# Patient Record
Sex: Female | Born: 1945
Health system: Southern US, Community
[De-identification: ages and names within clinical notes are randomized; demographics above are authoritative.]

## PROBLEM LIST (undated history)

## (undated) DIAGNOSIS — N6019 Diffuse cystic mastopathy of unspecified breast: Secondary | ICD-10-CM

## (undated) DIAGNOSIS — Z923 Personal history of irradiation: Secondary | ICD-10-CM

## (undated) DIAGNOSIS — Z780 Asymptomatic menopausal state: Secondary | ICD-10-CM

## (undated) DIAGNOSIS — E042 Nontoxic multinodular goiter: Secondary | ICD-10-CM

## (undated) DIAGNOSIS — E785 Hyperlipidemia, unspecified: Secondary | ICD-10-CM

## (undated) DIAGNOSIS — T7840XA Allergy, unspecified, initial encounter: Secondary | ICD-10-CM

## (undated) DIAGNOSIS — N943 Premenstrual tension syndrome: Secondary | ICD-10-CM

## (undated) DIAGNOSIS — M8580 Other specified disorders of bone density and structure, unspecified site: Secondary | ICD-10-CM

## (undated) DIAGNOSIS — C50919 Malignant neoplasm of unspecified site of unspecified female breast: Secondary | ICD-10-CM

## (undated) DIAGNOSIS — E049 Nontoxic goiter, unspecified: Secondary | ICD-10-CM

## (undated) HISTORY — DX: Malignant neoplasm of unspecified site of unspecified female breast: C50.919

## (undated) HISTORY — DX: Diffuse cystic mastopathy of unspecified breast: N60.19

## (undated) HISTORY — DX: Nontoxic multinodular goiter: E04.2

## (undated) HISTORY — DX: Other specified disorders of bone density and structure, unspecified site: M85.80

## (undated) HISTORY — DX: Hyperlipidemia, unspecified: E78.5

## (undated) HISTORY — DX: Allergy, unspecified, initial encounter: T78.40XA

## (undated) HISTORY — DX: Premenstrual tension syndrome: N94.3

## (undated) HISTORY — DX: Nontoxic goiter, unspecified: E04.9

## (undated) HISTORY — DX: Asymptomatic menopausal state: Z78.0

## (undated) HISTORY — PX: CERVICAL POLYPECTOMY: SHX88

---

## 2001-12-28 ENCOUNTER — Ambulatory Visit (HOSPITAL_COMMUNITY): Admission: RE | Admit: 2001-12-28 | Discharge: 2001-12-28 | Payer: Self-pay | Admitting: Gastroenterology

## 2002-12-28 ENCOUNTER — Encounter: Admission: RE | Admit: 2002-12-28 | Discharge: 2002-12-28 | Payer: Self-pay | Admitting: General Surgery

## 2003-07-14 ENCOUNTER — Encounter: Admission: RE | Admit: 2003-07-14 | Discharge: 2003-07-14 | Payer: Self-pay | Admitting: Family Medicine

## 2004-01-05 ENCOUNTER — Encounter: Admission: RE | Admit: 2004-01-05 | Discharge: 2004-01-05 | Payer: Self-pay | Admitting: Family Medicine

## 2004-05-28 DIAGNOSIS — E049 Nontoxic goiter, unspecified: Secondary | ICD-10-CM

## 2004-05-28 HISTORY — DX: Nontoxic goiter, unspecified: E04.9

## 2004-05-31 ENCOUNTER — Other Ambulatory Visit: Admission: RE | Admit: 2004-05-31 | Discharge: 2004-05-31 | Payer: Self-pay | Admitting: Family Medicine

## 2005-03-14 ENCOUNTER — Encounter: Admission: RE | Admit: 2005-03-14 | Discharge: 2005-03-14 | Payer: Self-pay | Admitting: Family Medicine

## 2005-04-04 ENCOUNTER — Encounter: Admission: RE | Admit: 2005-04-04 | Discharge: 2005-04-04 | Payer: Self-pay | Admitting: Family Medicine

## 2005-06-28 DIAGNOSIS — M8580 Other specified disorders of bone density and structure, unspecified site: Secondary | ICD-10-CM

## 2005-06-28 HISTORY — DX: Other specified disorders of bone density and structure, unspecified site: M85.80

## 2005-08-06 ENCOUNTER — Ambulatory Visit: Payer: Self-pay | Admitting: Family Medicine

## 2005-12-12 ENCOUNTER — Ambulatory Visit: Payer: Self-pay | Admitting: Family Medicine

## 2006-06-11 ENCOUNTER — Encounter: Admission: RE | Admit: 2006-06-11 | Discharge: 2006-06-11 | Payer: Self-pay | Admitting: Family Medicine

## 2006-06-24 ENCOUNTER — Ambulatory Visit: Payer: Self-pay | Admitting: Family Medicine

## 2006-07-11 ENCOUNTER — Other Ambulatory Visit: Admission: RE | Admit: 2006-07-11 | Discharge: 2006-07-11 | Payer: Self-pay | Admitting: Unknown Physician Specialty

## 2006-07-24 ENCOUNTER — Ambulatory Visit: Payer: Self-pay | Admitting: Family Medicine

## 2007-07-07 ENCOUNTER — Encounter: Admission: RE | Admit: 2007-07-07 | Discharge: 2007-07-07 | Payer: Self-pay | Admitting: Family Medicine

## 2007-09-03 ENCOUNTER — Ambulatory Visit: Payer: Self-pay | Admitting: Family Medicine

## 2007-09-03 ENCOUNTER — Other Ambulatory Visit: Admission: RE | Admit: 2007-09-03 | Discharge: 2007-09-03 | Payer: Self-pay | Admitting: Family Medicine

## 2007-09-03 LAB — HM PAP SMEAR: HM Pap smear: NORMAL

## 2007-09-08 ENCOUNTER — Ambulatory Visit: Payer: Self-pay | Admitting: Family Medicine

## 2008-04-18 ENCOUNTER — Ambulatory Visit: Payer: Self-pay | Admitting: Family Medicine

## 2008-07-21 ENCOUNTER — Encounter: Admission: RE | Admit: 2008-07-21 | Discharge: 2008-07-21 | Payer: Self-pay | Admitting: Family Medicine

## 2008-12-06 ENCOUNTER — Ambulatory Visit: Payer: Self-pay | Admitting: Family Medicine

## 2008-12-13 ENCOUNTER — Encounter: Admission: RE | Admit: 2008-12-13 | Discharge: 2008-12-13 | Payer: Self-pay | Admitting: Family Medicine

## 2009-07-26 ENCOUNTER — Encounter: Admission: RE | Admit: 2009-07-26 | Discharge: 2009-07-26 | Payer: Self-pay | Admitting: Family Medicine

## 2009-08-16 ENCOUNTER — Encounter: Admission: RE | Admit: 2009-08-16 | Discharge: 2009-08-16 | Payer: Self-pay | Admitting: Endocrinology

## 2009-08-16 ENCOUNTER — Encounter: Admission: RE | Admit: 2009-08-16 | Discharge: 2009-08-16 | Payer: Self-pay | Admitting: Family Medicine

## 2009-11-29 ENCOUNTER — Ambulatory Visit: Payer: Self-pay | Admitting: Family Medicine

## 2009-12-06 ENCOUNTER — Ambulatory Visit: Payer: Self-pay | Admitting: Family Medicine

## 2010-01-28 DIAGNOSIS — C50919 Malignant neoplasm of unspecified site of unspecified female breast: Secondary | ICD-10-CM

## 2010-01-28 HISTORY — DX: Malignant neoplasm of unspecified site of unspecified female breast: C50.919

## 2010-02-18 ENCOUNTER — Encounter: Payer: Self-pay | Admitting: Family Medicine

## 2010-03-21 ENCOUNTER — Other Ambulatory Visit: Payer: Self-pay | Admitting: Endocrinology

## 2010-03-21 DIAGNOSIS — E049 Nontoxic goiter, unspecified: Secondary | ICD-10-CM

## 2010-05-07 ENCOUNTER — Ambulatory Visit (INDEPENDENT_AMBULATORY_CARE_PROVIDER_SITE_OTHER): Payer: BC Managed Care – PPO | Admitting: Family Medicine

## 2010-05-07 DIAGNOSIS — R05 Cough: Secondary | ICD-10-CM

## 2010-05-07 DIAGNOSIS — J45902 Unspecified asthma with status asthmaticus: Secondary | ICD-10-CM

## 2010-05-07 DIAGNOSIS — R059 Cough, unspecified: Secondary | ICD-10-CM

## 2010-06-15 NOTE — Op Note (Signed)
   NAME:  Tamara Evans, Tamara Evans                         ACCOUNT NO.:  [id number]   MEDICAL RECORD NO.:  [id number]                   PATIENT TYPE:  AMB   LOCATION:  ENDO                                 FACILITY:  [facility]   PHYSICIAN:  John C. Madilyn Fireman, M.D.                 DATE OF BIRTH:  1945/05/15   DATE OF PROCEDURE:  12/28/2001  DATE OF DISCHARGE:                                 OPERATIVE REPORT   PROCEDURE:  Colonoscopy.   INDICATION FOR PROCEDURE:  Heme-positive stools.   DESCRIPTION OF PROCEDURE:  The patient was placed in the left lateral  decubitus position and placed on the pulse monitor with continuous low-flow  oxygen delivered by nasal cannula.  She was sedated with 100 mcg  IV  fentanyl and 8 mg IV Versed.  The Olympus video colonoscope was inserted  into the rectum and advanced to the cecum, confirmed by transillumination at  McBurney's point and visualization of the ileocecal valve and appendiceal  orifice.  The prep was excellent.  The cecum, ascending, transverse, and  descending colon all appeared normal with no masses, polyps, diverticula, or  other mucosal abnormalities.  In the sigmoid colon there were seen a few  scattered diverticula and no other abnormalities.  The rectum appeared  normal down to the anus where a retroflex view revealed some small internal  hemorrhoids.  The colonoscope was then withdrawn and the patient returned to  the recovery room in stable condition.  She tolerated the procedure well and  there were no immediate complications.   IMPRESSION:  1. Diverticulosis.  2. Small internal hemorrhoids.   PLAN:  Next colon screening by sigmoidoscopy in five years.                                                 John C. Madilyn Fireman, M.D.    [facility]/MEDQ  D:  12/28/2001  T:  12/28/2001  Job:  [id number]   cc:   [name], M.D.  [address] [place], [place] [address]  Fax: [phone]

## 2010-08-06 ENCOUNTER — Other Ambulatory Visit: Payer: Self-pay

## 2010-08-20 ENCOUNTER — Ambulatory Visit
Admission: RE | Admit: 2010-08-20 | Discharge: 2010-08-20 | Disposition: A | Discharge status: Home / Self Care | Payer: BC Managed Care – PPO | Source: Ambulatory Visit | Attending: Endocrinology | Admitting: Endocrinology

## 2010-08-20 DIAGNOSIS — E049 Nontoxic goiter, unspecified: Secondary | ICD-10-CM

## 2010-08-22 ENCOUNTER — Other Ambulatory Visit: Payer: Self-pay | Admitting: Family Medicine

## 2010-08-22 DIAGNOSIS — Z1231 Encounter for screening mammogram for malignant neoplasm of breast: Secondary | ICD-10-CM

## 2010-09-04 ENCOUNTER — Ambulatory Visit
Admission: RE | Admit: 2010-09-04 | Discharge: 2010-09-04 | Disposition: A | Discharge status: Home / Self Care | Payer: BC Managed Care – PPO | Source: Ambulatory Visit | Attending: Family Medicine | Admitting: Family Medicine

## 2010-09-04 DIAGNOSIS — Z1231 Encounter for screening mammogram for malignant neoplasm of breast: Secondary | ICD-10-CM

## 2010-09-07 ENCOUNTER — Other Ambulatory Visit: Payer: Self-pay | Admitting: Family Medicine

## 2010-09-07 DIAGNOSIS — R928 Other abnormal and inconclusive findings on diagnostic imaging of breast: Secondary | ICD-10-CM

## 2010-09-18 ENCOUNTER — Other Ambulatory Visit: Payer: Self-pay | Admitting: Family Medicine

## 2010-09-18 ENCOUNTER — Ambulatory Visit
Admission: RE | Admit: 2010-09-18 | Discharge: 2010-09-18 | Disposition: A | Discharge status: Home / Self Care | Payer: BC Managed Care – PPO | Source: Ambulatory Visit | Attending: Family Medicine | Admitting: Family Medicine

## 2010-09-18 ENCOUNTER — Other Ambulatory Visit: Payer: Self-pay | Admitting: Diagnostic Radiology

## 2010-09-18 DIAGNOSIS — R928 Other abnormal and inconclusive findings on diagnostic imaging of breast: Secondary | ICD-10-CM

## 2010-09-18 HISTORY — PX: BREAST BIOPSY: SHX20

## 2010-09-19 ENCOUNTER — Ambulatory Visit
Admission: RE | Admit: 2010-09-19 | Discharge: 2010-09-19 | Disposition: A | Discharge status: Home / Self Care | Payer: BC Managed Care – PPO | Source: Ambulatory Visit | Attending: Family Medicine | Admitting: Family Medicine

## 2010-09-19 ENCOUNTER — Encounter: Payer: Self-pay | Admitting: Family Medicine

## 2010-09-19 ENCOUNTER — Other Ambulatory Visit: Payer: Self-pay | Admitting: Family Medicine

## 2010-09-19 DIAGNOSIS — Z853 Personal history of malignant neoplasm of breast: Secondary | ICD-10-CM | POA: Insufficient documentation

## 2010-09-19 DIAGNOSIS — R928 Other abnormal and inconclusive findings on diagnostic imaging of breast: Secondary | ICD-10-CM

## 2010-09-19 DIAGNOSIS — C50911 Malignant neoplasm of unspecified site of right female breast: Secondary | ICD-10-CM

## 2010-09-20 NOTE — Progress Notes (Signed)
Pt in today for lab draw for bun and creat for mr on Sunday night. Blood drawn from right medial cubital vein site unremarkable. Pt tolerated it well.

## 2010-09-23 ENCOUNTER — Ambulatory Visit
Admission: RE | Admit: 2010-09-23 | Discharge: 2010-09-23 | Disposition: A | Discharge status: Home / Self Care | Payer: BC Managed Care – PPO | Source: Ambulatory Visit | Attending: Family Medicine | Admitting: Family Medicine

## 2010-09-23 DIAGNOSIS — C50911 Malignant neoplasm of unspecified site of right female breast: Secondary | ICD-10-CM

## 2010-09-23 MED ORDER — GADOBENATE DIMEGLUMINE 529 MG/ML IV SOLN
14.0000 mL | Freq: Once | INTRAVENOUS | Status: AC
  Administered 2010-09-23: 14 mL via INTRAVENOUS

## 2010-09-24 ENCOUNTER — Other Ambulatory Visit: Payer: Self-pay | Admitting: Family Medicine

## 2010-09-24 DIAGNOSIS — R928 Other abnormal and inconclusive findings on diagnostic imaging of breast: Secondary | ICD-10-CM

## 2010-09-25 ENCOUNTER — Other Ambulatory Visit: Payer: Self-pay | Admitting: Family Medicine

## 2010-09-25 ENCOUNTER — Ambulatory Visit
Admission: RE | Admit: 2010-09-25 | Discharge: 2010-09-25 | Disposition: A | Discharge status: Home / Self Care | Payer: BC Managed Care – PPO | Source: Ambulatory Visit | Attending: Family Medicine | Admitting: Family Medicine

## 2010-09-25 DIAGNOSIS — R928 Other abnormal and inconclusive findings on diagnostic imaging of breast: Secondary | ICD-10-CM

## 2010-09-26 ENCOUNTER — Other Ambulatory Visit: Payer: Self-pay | Admitting: Oncology

## 2010-09-26 ENCOUNTER — Ambulatory Visit (HOSPITAL_BASED_OUTPATIENT_CLINIC_OR_DEPARTMENT_OTHER): Payer: BC Managed Care – PPO | Admitting: General Surgery

## 2010-09-26 ENCOUNTER — Encounter (HOSPITAL_BASED_OUTPATIENT_CLINIC_OR_DEPARTMENT_OTHER): Payer: BC Managed Care – PPO | Admitting: Oncology

## 2010-09-26 ENCOUNTER — Ambulatory Visit
Admission: RE | Admit: 2010-09-26 | Discharge: 2010-09-26 | Disposition: A | Discharge status: Home / Self Care | Payer: BC Managed Care – PPO | Source: Ambulatory Visit | Attending: Family Medicine | Admitting: Family Medicine

## 2010-09-26 ENCOUNTER — Other Ambulatory Visit: Payer: Self-pay | Admitting: Diagnostic Radiology

## 2010-09-26 ENCOUNTER — Encounter (INDEPENDENT_AMBULATORY_CARE_PROVIDER_SITE_OTHER): Payer: Self-pay | Admitting: General Surgery

## 2010-09-26 VITALS — BP 160/94 | HR 71 | Temp 98.1°F | Resp 20 | Ht 64.0 in | Wt 155.8 lb

## 2010-09-26 DIAGNOSIS — R928 Other abnormal and inconclusive findings on diagnostic imaging of breast: Secondary | ICD-10-CM

## 2010-09-26 DIAGNOSIS — C50419 Malignant neoplasm of upper-outer quadrant of unspecified female breast: Secondary | ICD-10-CM

## 2010-09-26 LAB — COMPREHENSIVE METABOLIC PANEL
AST: 25 U/L (ref 0–37)
Alkaline Phosphatase: 75 U/L (ref 39–117)
Glucose, Bld: 161 mg/dL — ABNORMAL HIGH (ref 70–99)
Sodium: 143 mEq/L (ref 135–145)
Total Bilirubin: 0.5 mg/dL (ref 0.3–1.2)
Total Protein: 7.2 g/dL (ref 6.0–8.3)

## 2010-09-26 LAB — CBC WITH DIFFERENTIAL/PLATELET
BASO%: 0.3 % (ref 0.0–2.0)
EOS%: 1.9 % (ref 0.0–7.0)
LYMPH%: 25.8 % (ref 14.0–49.7)
MCH: 27.2 pg (ref 25.1–34.0)
MCHC: 34.2 g/dL (ref 31.5–36.0)
MCV: 79.6 fL (ref 79.5–101.0)
MONO%: 5.5 % (ref 0.0–14.0)
Platelets: 201 10*3/uL (ref 145–400)
RBC: 5.54 10*6/uL — ABNORMAL HIGH (ref 3.70–5.45)

## 2010-09-26 MED ORDER — GADOBENATE DIMEGLUMINE 529 MG/ML IV SOLN
14.0000 mL | Freq: Once | INTRAVENOUS | Status: AC | PRN
  Administered 2010-09-26: 14 mL via INTRAVENOUS

## 2010-09-26 NOTE — Progress Notes (Signed)
Chief Complaint  Patient presents with  . Breast Cancer    HPI Tamara Evans is a 65 y.o. female.   HPI This is a 65 year old female who is otherwise healthy. She underwent a routine screening mammogram recently that showed a right upper outer quadrant mass measuring 1 cm. This was confirmed on ultrasound to be a 9 mm lesion. She had a left sided abnormality was then biopsied it showed fat necrosis with no further workup recommended as this was concordant.  The biopsy of the right sided lesion showed an invasive mammary carcinoma with some lobular features. This was grade 1-2. Estrogen receptor was positive at 100%, progesterone receptor positive at 40%, proliferation marker was 64%, and HER-2/neu was not amplified. She underwent an MRI which showed the known cancer to be 1.2 x 1.3 x 1.2 cm and no adenopathy noted. She also had another right-sided lesion measuring 1 x 1.2 x 0.5 cm. There was no ultrasound correlate. An MRI guided biopsy was obtained this morning and is due to be out tomorrow. She reports no complaints referable to her breasts and no real prior breast history. Past Medical History  Diagnosis Date  . Hyperlipidemia   . Thyroid disease     Past Surgical History  Procedure Date  . Cervical polypectomy     in the patient's 20's    Family History  Problem Relation Age of Onset  . Cancer Maternal Grandfather   . Cancer Paternal Grandfather     Social History History  Substance Use Topics  . Smoking status: Never Smoker   . Smokeless tobacco: Never Used  . Alcohol Use: 0.6 oz/week    1 Glasses of wine per week    Allergies  Allergen Reactions  . Codeine Shortness Of Breath    Current Outpatient Prescriptions  Medication Sig Dispense Refill  . alendronate (FOSAMAX) 70 MG tablet Take 70 mg by mouth every 7 (seven) days. Take with a full glass of water on an empty stomach.       Marland Kitchen aspirin 81 MG tablet Take 81 mg by mouth daily.        Marland Kitchen atorvastatin (LIPITOR) 20  MG tablet Take 20 mg by mouth daily.        . calcium carbonate (OS-CAL) 600 MG TABS Take 600 mg by mouth 2 (two) times daily with a meal.        . fish oil-omega-3 fatty acids 1000 MG capsule Take 1 g by mouth daily.        Marland Kitchen levothyroxine (SYNTHROID, LEVOTHROID) 88 MCG tablet Take 88 mcg by mouth daily.        . Multiple Vitamin (MULTIVITAMIN) tablet Take 1 tablet by mouth daily.         No current facility-administered medications for this visit.   Facility-Administered Medications Ordered in Other Visits  Medication Dose Route Frequency Provider Last Rate Last Dose  . gadobenate dimeglumine (MULTIHANCE) injection 14 mL  14 mL Intravenous Once PRN Medication Radiologist   14 mL at 09/26/10 9147    Review of Systems Review of Systems  Psychiatric/Behavioral: The patient is nervous/anxious.   All other systems reviewed and are negative.    Blood pressure 160/94, pulse 71, temperature 98.1 F (36.7 C), resp. rate 20, height 5\' 4"  (1.626 m), weight 155 lb 12.8 oz (70.67 kg).  Physical Exam Physical Exam  Constitutional: She appears well-developed and well-nourished.  Eyes: No scleral icterus.  Neck: Neck supple.  Cardiovascular: Normal rate, regular rhythm and  normal heart sounds.   Respiratory: Effort normal and breath sounds normal. She has no wheezes. She has no rales. Right breast exhibits mass. Right breast exhibits no inverted nipple, no nipple discharge, no skin change and no tenderness. Left breast exhibits no inverted nipple, no mass, no nipple discharge, no skin change and no tenderness. Breasts are symmetrical.         No axillary adenopathy bilaterally  GI: Soft. Bowel sounds are normal. There is no hepatomegaly.  Lymphadenopathy:    She has no cervical adenopathy.       Assessment    Early stage right breast cancer    Plan    We discussed the staging and pathophysiology of breast cancer. We discussed all of the treatment options for breast cancer including  surgery, chemotherapy, radiation therapy and antihormonal therapy. It appears she has an early stage breast cancer. She has a second lesion on MRI that was biopsied this morning and this is going to play a role in what type of surgery we do. I discussed with her at length lumpectomy versus mastectomy. I think it this other lesion is benign and it needs no further evaluation we could certainly proceed with a wire-guided lumpectomy. If the other lesion does end up being cancer or needs to be excised that I will need to look at her clip films but I think there may be a possibility we could include this with a larger lumpectomy. If not then she will need a mastectomy.  I discussed with her the local recurrence rate associated with the lumpectomy as being about 5% combined with radiation therapy. We discussed the local recurrence rate after a mastectomy being about 1%. I told her that the survival with a lumpectomy and negative margins combined with radiation therapy versus a mastectomy is equivalent that whatever stage or cancer ends up being. The biggest question right now is whether she is a candidate technically to perform a lumpectomy. She is motivated to do a lumpectomy I think we can make every effort to do that. I discussed with her also a sentinel lymph node biopsy the rationale and performance of this procedure. I told her I would wait for the final pathology to evaluate her nodes. She knows there is a chance that she may need to return to the operating room for axillary lymph node dissection. I also discussed with her that 10-15% positive margin right with lumpectomy requiring a return to the operating room. We also discussed a referral to plastic surgery if it did appear she will need a mastectomy.  I will call her tomorrow with her pathology results and we'll move ahead with surgery.       WAKEFIELD,MATTHEW 09/26/2010, 2:35 PM

## 2010-09-26 NOTE — Progress Notes (Deleted)
Chief Complaint  Patient presents with  . Breast Cancer    HPI Tamara Evans is a 65 y.o. female.  *** HPI  Past Medical History  Diagnosis Date  . Hyperlipidemia   . Thyroid disease     Past Surgical History  Procedure Date  . Cervical polypectomy     in the patient's 20's    Family History  Problem Relation Age of Onset  . Cancer Maternal Grandfather   . Cancer Paternal Grandfather     Social History History  Substance Use Topics  . Smoking status: Never Smoker   . Smokeless tobacco: Never Used  . Alcohol Use: 0.6 oz/week    1 Glasses of wine per week    Allergies  Allergen Reactions  . Codeine     Current Outpatient Prescriptions  Medication Sig Dispense Refill  . alendronate (FOSAMAX) 70 MG tablet Take 70 mg by mouth every 7 (seven) days. Take with a full glass of water on an empty stomach.       Marland Kitchen aspirin 81 MG tablet Take 81 mg by mouth daily.        Marland Kitchen atorvastatin (LIPITOR) 20 MG tablet Take 20 mg by mouth daily.        . calcium carbonate (OS-CAL) 600 MG TABS Take 600 mg by mouth 2 (two) times daily with a meal.        . fish oil-omega-3 fatty acids 1000 MG capsule Take 1 g by mouth daily.        Marland Kitchen levothyroxine (SYNTHROID, LEVOTHROID) 88 MCG tablet Take 88 mcg by mouth daily.        . Multiple Vitamin (MULTIVITAMIN) tablet Take 1 tablet by mouth daily.         No current facility-administered medications for this visit.   Facility-Administered Medications Ordered in Other Visits  Medication Dose Route Frequency Provider Last Rate Last Dose  . gadobenate dimeglumine (MULTIHANCE) injection 14 mL  14 mL Intravenous Once PRN Medication Radiologist   14 mL at 09/26/10 0852    Review of Systems ROS  Blood pressure 160/94, pulse 71, temperature 98.1 F (36.7 C), resp. rate 20, height 5\' 4"  (1.626 m), weight 155 lb 12.8 oz (70.67 kg).  Physical Exam Physical Exam   Data Reviewed ***  Assessment    ***    Plan    ***        Trason Shifflet 09/26/2010, 2:32 PM

## 2010-09-26 NOTE — Patient Instructions (Signed)
I will call and follow up with you tomorrow after I have pathology results.

## 2010-09-27 ENCOUNTER — Other Ambulatory Visit: Payer: Self-pay

## 2010-09-27 ENCOUNTER — Telehealth: Payer: Self-pay | Admitting: Family Medicine

## 2010-09-27 ENCOUNTER — Other Ambulatory Visit (INDEPENDENT_AMBULATORY_CARE_PROVIDER_SITE_OTHER): Payer: Self-pay | Admitting: General Surgery

## 2010-09-27 DIAGNOSIS — C50911 Malignant neoplasm of unspecified site of right female breast: Secondary | ICD-10-CM

## 2010-09-27 DIAGNOSIS — C50919 Malignant neoplasm of unspecified site of unspecified female breast: Secondary | ICD-10-CM

## 2010-09-27 MED ORDER — ATORVASTATIN CALCIUM 20 MG PO TABS: 20.0000 mg | ORAL_TABLET | Freq: Every day | ORAL | Status: DC

## 2010-09-27 NOTE — Telephone Encounter (Signed)
Sent pt med in

## 2010-10-03 ENCOUNTER — Other Ambulatory Visit (INDEPENDENT_AMBULATORY_CARE_PROVIDER_SITE_OTHER): Payer: Self-pay | Admitting: General Surgery

## 2010-10-03 ENCOUNTER — Encounter (HOSPITAL_COMMUNITY)
Admission: RE | Admit: 2010-10-03 | Discharge: 2010-10-03 | Disposition: A | Discharge status: Home / Self Care | Payer: BC Managed Care – PPO | Source: Ambulatory Visit | Attending: General Surgery | Admitting: General Surgery

## 2010-10-03 ENCOUNTER — Ambulatory Visit (HOSPITAL_COMMUNITY)
Admission: RE | Admit: 2010-10-03 | Discharge: 2010-10-03 | Disposition: A | Discharge status: Home / Self Care | Payer: BC Managed Care – PPO | Source: Ambulatory Visit | Attending: General Surgery | Admitting: General Surgery

## 2010-10-03 DIAGNOSIS — C50911 Malignant neoplasm of unspecified site of right female breast: Secondary | ICD-10-CM

## 2010-10-03 DIAGNOSIS — Z01818 Encounter for other preprocedural examination: Secondary | ICD-10-CM | POA: Insufficient documentation

## 2010-10-03 DIAGNOSIS — Z01812 Encounter for preprocedural laboratory examination: Secondary | ICD-10-CM | POA: Insufficient documentation

## 2010-10-03 DIAGNOSIS — C50919 Malignant neoplasm of unspecified site of unspecified female breast: Secondary | ICD-10-CM | POA: Insufficient documentation

## 2010-10-03 LAB — CBC
Hemoglobin: 15.2 g/dL — ABNORMAL HIGH (ref 12.0–15.0)
MCH: 26.9 pg (ref 26.0–34.0)
MCV: 79.6 fL (ref 78.0–100.0)
RBC: 5.65 MIL/uL — ABNORMAL HIGH (ref 3.87–5.11)

## 2010-10-03 LAB — BASIC METABOLIC PANEL
BUN: 24 mg/dL — ABNORMAL HIGH (ref 6–23)
Chloride: 105 mEq/L (ref 96–112)
GFR calc Af Amer: >60 mL/min (ref >60)
GFR calc non Af Amer: >60 mL/min (ref >60)
Potassium: 3.6 mEq/L (ref 3.5–5.1)
Sodium: 145 mEq/L (ref 135–145)

## 2010-10-03 LAB — DIFFERENTIAL
Eosinophils Absolute: 0.2 10*3/uL (ref 0.0–0.7)
Lymphs Abs: 1.4 10*3/uL (ref 0.7–4.0)
Monocytes Relative: 9 % (ref 3–12)
Neutrophils Relative %: 66 % (ref 43–77)

## 2010-10-03 LAB — SURGICAL PCR SCREEN
MRSA, PCR: NEGATIVE
Staphylococcus aureus: NEGATIVE

## 2010-10-05 ENCOUNTER — Ambulatory Visit
Admission: RE | Admit: 2010-10-05 | Discharge: 2010-10-05 | Disposition: A | Discharge status: Home / Self Care | Payer: BC Managed Care – PPO | Source: Ambulatory Visit | Attending: General Surgery | Admitting: General Surgery

## 2010-10-05 ENCOUNTER — Ambulatory Visit (HOSPITAL_COMMUNITY)
Admission: RE | Admit: 2010-10-05 | Discharge: 2010-10-05 | Disposition: A | Discharge status: Home / Self Care | Payer: BC Managed Care – PPO | Source: Ambulatory Visit | Attending: General Surgery | Admitting: General Surgery

## 2010-10-05 ENCOUNTER — Other Ambulatory Visit (INDEPENDENT_AMBULATORY_CARE_PROVIDER_SITE_OTHER): Payer: Self-pay | Admitting: General Surgery

## 2010-10-05 DIAGNOSIS — Z01818 Encounter for other preprocedural examination: Secondary | ICD-10-CM | POA: Insufficient documentation

## 2010-10-05 DIAGNOSIS — Z01812 Encounter for preprocedural laboratory examination: Secondary | ICD-10-CM | POA: Insufficient documentation

## 2010-10-05 DIAGNOSIS — Z01810 Encounter for preprocedural cardiovascular examination: Secondary | ICD-10-CM | POA: Insufficient documentation

## 2010-10-05 DIAGNOSIS — C50919 Malignant neoplasm of unspecified site of unspecified female breast: Secondary | ICD-10-CM

## 2010-10-05 DIAGNOSIS — C50911 Malignant neoplasm of unspecified site of right female breast: Secondary | ICD-10-CM

## 2010-10-05 DIAGNOSIS — E785 Hyperlipidemia, unspecified: Secondary | ICD-10-CM | POA: Insufficient documentation

## 2010-10-05 DIAGNOSIS — C50419 Malignant neoplasm of upper-outer quadrant of unspecified female breast: Secondary | ICD-10-CM | POA: Insufficient documentation

## 2010-10-05 HISTORY — PX: BREAST LUMPECTOMY: SHX2

## 2010-10-05 MED ORDER — TECHNETIUM TC 99M SULFUR COLLOID FILTERED
1.0000 | Freq: Once | INTRAVENOUS | Status: AC | PRN
  Administered 2010-10-05: 1 via INTRADERMAL

## 2010-10-05 NOTE — Op Note (Signed)
Tamara Evans, Tamara Evans NO.:  [id number]  MEDICAL RECORD NO.:  [id number]  LOCATION:  [facility]SC                         FACILITY:  [facility]  PHYSICIAN:  [name], MDDATE OF BIRTH:  May 29, 1945  DATE OF PROCEDURE:  10/05/2010 DATE OF DISCHARGE:                              OPERATIVE REPORT   PREOPERATIVE DIAGNOSIS:  Clinical stage I right breast cancer.  POSTOPERATIVE DIAGNOSIS:  Clinical stage I right breast cancer.  PROCEDURE: 1. Right breast wire guided lumpectomy. 2. Injection of methylene blue dye for sentinel node identification. 3. Right axillary sentinel node biopsy.  SURGEON:  [name], MD.  ASSISTANT:  None.  ANESTHESIA:  General.  SPECIMEN: 1. Right breast tissue to pathology. 2. Additional right breast tissue to pathology, both marked with paint     kit. 3. Right axillary sentinel node with the first count being 16, 28, the     second nodal packets count being 228.  DISPOSITION OF SPECIMENS:  Pathology.  ESTIMATED BLOOD LOSS:  Minimal.  COMPLICATIONS:  None.  DRAINS:  None.  DISPOSITION:  The patient to recovery room in stable condition.  INDICATIONS:  This is a 64 year old female on a screening mammogram had a right upper outer quadrant breast mass measured about 1 cm on ultrasound.  This was about 9 mm.  She also had a left-sided abnormality, biopsy of that showed fat necrosis with no further workup recommended and this was concordant.  Biopsy of the right side lesion shows invasive mammary carcinoma with some lobular features, it was grade 1 to 2, estrogen receptor was positive with 100% progesterone at 40%, proliferation marker was 64%, HER-2/Neu was not amplified.  MRI showed a known cancer to be about 1.2 x 1.3 x 1.2 cm as well.  Another right-sided lesion that was biopsied by MR showing only fibrocystic changes.  She and I discussed all the different options for treatment of her breast cancer.  We elected to  attempt breast conservation therapy with a sentinel node biopsy.  DESCRIPTION OF PROCEDURE:  After informed consent was obtained, the patient was [facility] where she had a wire placed in the lesion by Dr. Juel Burrow.  I had the mammograms available for my review during the operation.  She was then brought to [facility].  She had technetium administered in the standard periareolar fashion.  She was then taken to the operating room.  She was administered 1 gram of intravenous cefazolin.  Sequential compression device was placed on lower extremities.  She was then placed under general anesthesia with an LMA.  A surgical time-out was then performed.  I then injected 1 mL of saline methylene blue dye mixture in all the cardinal positions around her areola and massaged it for 2 minutes.  Her breast and axilla were then prepped and draped in standard sterile surgical fashion.  I made a curvilinear incision in the upper outer quadrant of her breast.  This was really in the axillary tail of her breast.  I used cautery to take this down to the pectoralis muscle making this the deep margin.  I then excised the tissue surrounding the wire as well as  the lesion.  The lesion was towards the end of my specimen and the wire.  This was all passed off the table.  Faxitron mammogram confirmed removal of the mass and the clip.  I did take an additional margin inferiorly as well as laterally to ensure I had a clear of margin hopefully.  There were no other abnormalities that I palpated.  I then did the sentinel node biopsy through the same incision.  I noted two nodes with the counts above.  These were removed. The background radioactivity was less than 10.  Hemostasis was then obtained.  I placed two clips deep in the cavity as well as one clip in each cardinal position.  I did leave some clips in the axilla as well to obtain hemostasis.  Once I had done this, I lifted the breast tissue  off the pectoralis muscle.  I closed the deep breast tissue with 2-0 Vicryl, then 3-0 Vicryl for the dermis, and 4-0 Monocryl for the skin.  Steri- Strips and sterile dressing were placed.  A breast binder was placed over this.  She tolerated this well, extubated in the operating room, and transferred to the recovery room in stable condition.     [name], MD     [facility]/MEDQ  D:  10/05/2010  T:  10/05/2010  Job:  [id number]  cc:   [name], M.D. Pierce Crane, M.D., F.R.C.P.C. Billie Lade, Ph.D., M.D.  Electronically Signed by Emelia Loron MD on 10/05/2010 09:20:28 PM

## 2010-10-22 ENCOUNTER — Encounter (INDEPENDENT_AMBULATORY_CARE_PROVIDER_SITE_OTHER): Payer: Self-pay | Admitting: General Surgery

## 2010-10-22 ENCOUNTER — Ambulatory Visit (INDEPENDENT_AMBULATORY_CARE_PROVIDER_SITE_OTHER): Payer: BC Managed Care – PPO | Admitting: General Surgery

## 2010-10-22 VITALS — BP 132/86 | HR 80 | Temp 97.3°F | Resp 16 | Ht 63.0 in | Wt 154.4 lb

## 2010-10-22 DIAGNOSIS — Z09 Encounter for follow-up examination after completed treatment for conditions other than malignant neoplasm: Secondary | ICD-10-CM

## 2010-10-22 NOTE — Progress Notes (Signed)
Subjective:     Patient ID: Tamara Evans, female   DOB: 10/20/1945, 65 y.o.   MRN: [id number]  HPI This is a 65 year old female who had a newly diagnosed right breast cancer. She was seen in the multidisciplinary clinic and evaluated. I took her to the operating room on September 7 and performed a right breast lumpectomy and a sentinel node biopsy. She reports some swelling in her breast as well as some tenderness but is otherwise doing well. Her initial lumpectomy pathology showed invasive ductal carcinoma measuring 8 mm it focally involved the lateral margin. The inferior margin was 2 mm. I thought this was the case on the specimen mammogram and so I excised an additional right breast excision that was in the lateral and inferior area. There was no further cancer in this area and her margins are negative. Both nodes are also benign giving her a stage I right breast cancer. Her estrogen receptors positive at 100% her progesterone receptors positive at 40%. Her proliferation index is 64% and her HER-2/neu is not amplified.  Review of Systems     Objective:   Physical Exam Moderate edema at site of surgery, incision clean without infection    Assessment:     Stage I right breast cancer s/p lumpectomy with negative margins, node negative    Plan:         I told her the edema should go down over some time. We discussed also increase her activity as it is somewhat limited today. I gave her the sheet of physical therapy exercises as well as a prescription to attend the after breast cancer physical therapy class. If she is not much better in 2 to 3 weeks I asked her to call me and will refer her to see a physical therapist for individual care as well. She is going to see Dr. Welton Flakes to decide on her further adjuvant therapy. I'm going to schedule her in 4 months at this point we'll have her come back sooner as needed.

## 2010-10-31 ENCOUNTER — Ambulatory Visit
Admission: RE | Admit: 2010-10-31 | Discharge: 2010-10-31 | Disposition: A | Discharge status: Home / Self Care | Payer: BC Managed Care – PPO | Source: Ambulatory Visit | Attending: Radiation Oncology | Admitting: Radiation Oncology

## 2010-10-31 ENCOUNTER — Encounter (HOSPITAL_BASED_OUTPATIENT_CLINIC_OR_DEPARTMENT_OTHER): Payer: BC Managed Care – PPO | Admitting: Oncology

## 2010-10-31 DIAGNOSIS — Z510 Encounter for antineoplastic radiation therapy: Secondary | ICD-10-CM | POA: Insufficient documentation

## 2010-10-31 DIAGNOSIS — Z170 Estrogen receptor positive status [ER+]: Secondary | ICD-10-CM

## 2010-10-31 DIAGNOSIS — C50919 Malignant neoplasm of unspecified site of unspecified female breast: Secondary | ICD-10-CM | POA: Insufficient documentation

## 2010-10-31 DIAGNOSIS — C50419 Malignant neoplasm of upper-outer quadrant of unspecified female breast: Secondary | ICD-10-CM

## 2010-11-08 ENCOUNTER — Encounter: Payer: Self-pay | Admitting: *Deleted

## 2010-12-03 ENCOUNTER — Ambulatory Visit
Admission: RE | Admit: 2010-12-03 | Discharge: 2010-12-03 | Disposition: A | Discharge status: Home / Self Care | Payer: BC Managed Care – PPO | Source: Ambulatory Visit | Attending: Radiation Oncology | Admitting: Radiation Oncology

## 2010-12-04 ENCOUNTER — Ambulatory Visit
Admission: RE | Admit: 2010-12-04 | Discharge: 2010-12-04 | Disposition: A | Discharge status: Home / Self Care | Payer: BC Managed Care – PPO | Source: Ambulatory Visit | Attending: Radiation Oncology | Admitting: Radiation Oncology

## 2010-12-04 NOTE — Progress Notes (Signed)
DIAGNOSIS:  Right breast cancer.  NARRATIVE:  Tamara Evans is seen today for weekly assessment.  She has completed 2340 cGy of a planned 6300 cGy directed at the right breast area.  The patient has noticed some very mild pruritus within the breast area but otherwise is tolerating her treatments well.  She denies any discomfort in the breast or fatigue.  EXAMINATION:  The skin of the right breast shows some erythema but really no significant reaction at this point.  Lungs:  Clear.  Heart: Has a regular rhythm and rate.  There is no palpable supraclavicular or axillary adenopathy.  IMPRESSION AND PLAN:  The patient is tolerating her radiation treatments well at this time.  The patient's radiation fields are setting up accurately.  The patient's radiation chart was checked today.  Plan is to continue with breast conservation therapy to a cumulative dose of 6300 cGy.    ______________________________ Tamara Evans, Ph.D., M.D. JDK/MEDQ  D:  12/03/2010  T:  12/03/2010  Job:  [id number]

## 2010-12-05 ENCOUNTER — Ambulatory Visit
Admission: RE | Admit: 2010-12-05 | Discharge: 2010-12-05 | Disposition: A | Discharge status: Home / Self Care | Payer: BC Managed Care – PPO | Source: Ambulatory Visit | Attending: Radiation Oncology | Admitting: Radiation Oncology

## 2010-12-06 ENCOUNTER — Ambulatory Visit
Admission: RE | Admit: 2010-12-06 | Discharge: 2010-12-06 | Disposition: A | Discharge status: Home / Self Care | Payer: BC Managed Care – PPO | Source: Ambulatory Visit | Attending: Radiation Oncology | Admitting: Radiation Oncology

## 2010-12-07 ENCOUNTER — Ambulatory Visit
Admission: RE | Admit: 2010-12-07 | Discharge: 2010-12-07 | Disposition: A | Discharge status: Home / Self Care | Payer: BC Managed Care – PPO | Source: Ambulatory Visit | Attending: Radiation Oncology | Admitting: Radiation Oncology

## 2010-12-10 ENCOUNTER — Ambulatory Visit
Admission: RE | Admit: 2010-12-10 | Discharge: 2010-12-10 | Disposition: A | Discharge status: Home / Self Care | Payer: BC Managed Care – PPO | Source: Ambulatory Visit | Attending: Radiation Oncology | Admitting: Radiation Oncology

## 2010-12-10 VITALS — Wt 157.6 lb

## 2010-12-10 DIAGNOSIS — C50919 Malignant neoplasm of unspecified site of unspecified female breast: Secondary | ICD-10-CM

## 2010-12-10 NOTE — Progress Notes (Signed)
DIAGNOSIS:  Right breast cancer.  NARRATIVE:  Tamara Evans is seen today for weekly assessment.  She has completed 30-40 cGy of a planned 6300 cGy directed at the right breast area.  Over the past few days the patient has noticed sensitivity in the right axillary region.  On examination there is a significant area of erythema in this area as well as throughout much of the breast.  I am unsure whether the patient may be having a reaction to her nonaluminum deodorant.  In light of this skin reaction, I recommended she stop using this.  In addition, the patient will be switched to Biafine for her skin reaction in the right breast.  The patient's axillary reaction appears to be in most part outside of her radiation field.  The lungs are clear. The heart has a regular rhythm and rate.  IMPRESSION AND PLAN:  The patient is tolerating her treatments reasonably well except for issues as above.  The patient's radiation fields are setting up accurately.  The patient's radiation chart was checked today.  The plan is to continue with breast conservation therapy as planned, to a cumulative dose of 6300 cGy.    ______________________________ Billie Lade, Ph.D., M.D. JDK/MEDQ  D:  12/10/2010  T:  12/10/2010  Job:  [id number]

## 2010-12-11 ENCOUNTER — Ambulatory Visit
Admission: RE | Admit: 2010-12-11 | Discharge: 2010-12-11 | Disposition: A | Discharge status: Home / Self Care | Payer: BC Managed Care – PPO | Source: Ambulatory Visit | Attending: Radiation Oncology | Admitting: Radiation Oncology

## 2010-12-12 ENCOUNTER — Ambulatory Visit
Admission: RE | Admit: 2010-12-12 | Discharge: 2010-12-12 | Disposition: A | Discharge status: Home / Self Care | Payer: BC Managed Care – PPO | Source: Ambulatory Visit | Attending: Radiation Oncology | Admitting: Radiation Oncology

## 2010-12-12 NOTE — Progress Notes (Signed)
DIAGNOSIS:  Right breast cancer.  NARRATIVE:  Earlier today Ms. Sidener underwent additional planning for radiation therapy directed at the right breast area.  The patient's treatment planning CT scan was reviewed, and the patient subsequently had setup of a boost field directed at the site of presentation in the upper outer quadrant of the right breast.  The patient will be treated with a 3-field photon beam arrangement, given the depth within the breast.  The patient will be treated with a right anterior oblique, a right posterior oblique, and a left anterior oblique field.  A combination of 6 and 10 MV photons will be used to deliver the patient's treatment.  A computerized isodose plan will be generated for treatment.  TREATMENT PLAN:  The patient is to proceed with 7 additional treatments at 180 cGy per day for an additional dose of 1260 cGy and a cumulative dose to the target area of 6300 cGy.    ______________________________ Billie Lade, Ph.D., M.D. JDK/MEDQ  D:  12/12/2010  T:  12/12/2010  Job:  [id number]

## 2010-12-13 ENCOUNTER — Ambulatory Visit
Admission: RE | Admit: 2010-12-13 | Discharge: 2010-12-13 | Disposition: A | Discharge status: Home / Self Care | Payer: BC Managed Care – PPO | Source: Ambulatory Visit | Attending: Radiation Oncology | Admitting: Radiation Oncology

## 2010-12-14 ENCOUNTER — Ambulatory Visit: Payer: BC Managed Care – PPO

## 2010-12-14 ENCOUNTER — Ambulatory Visit: Admission: RE | Admit: 2010-12-14 | Payer: BC Managed Care – PPO | Source: Ambulatory Visit

## 2010-12-15 ENCOUNTER — Ambulatory Visit
Admission: RE | Admit: 2010-12-15 | Discharge: 2010-12-15 | Disposition: A | Discharge status: Home / Self Care | Payer: BC Managed Care – PPO | Source: Ambulatory Visit | Attending: Radiation Oncology | Admitting: Radiation Oncology

## 2010-12-17 ENCOUNTER — Ambulatory Visit
Admission: RE | Admit: 2010-12-17 | Discharge: 2010-12-17 | Disposition: A | Discharge status: Home / Self Care | Payer: BC Managed Care – PPO | Source: Ambulatory Visit | Attending: Radiation Oncology | Admitting: Radiation Oncology

## 2010-12-17 VITALS — Wt 155.8 lb

## 2010-12-17 DIAGNOSIS — C50919 Malignant neoplasm of unspecified site of unspecified female breast: Secondary | ICD-10-CM

## 2010-12-17 NOTE — Progress Notes (Signed)
Has completed 24/28 fractions rt to right breast. Whole breast with significant redness but no breaks in skin.

## 2010-12-17 NOTE — Progress Notes (Signed)
DIAGNOSIS:  Right breast cancer.  NARRATIVE:  Tamara Evans is seen today for weekly assessment.  She has completed 4320 cGy of a planned 6300 cGy directed at the right breast area.  The patient has minimal fatigue at this time.  She has noticed sensitivity and discomfort, particularly in the upper outer aspect of the breast area.  The patient has been using Biafine for her skin.  PHYSICAL EXAMINATION:  There is brisk erythema throughout the treatment area without any skin breakdown appreciated.  There is also some swelling noted of the breast, which has been noted on previous exams. The lungs are clear.  The heart has a regular rhythm and rate.  There is no palpable supraclavicular adenopathy.  IMPRESSION AND PLAN:  The patient is tolerating her radiation treatments well at this time.  The patient's radiation fields are setting up accurately.  The patient's radiation chart was checked today.  The plan is to continue to a cumulative dose of 6300 cGy.  The patient has 11 treatments remaining to complete her therapy.  In light of the patient's skin sensitivity she was given Hydrogel dressing to place on the upper outer aspect of the breast area, with instructions on its use.    ______________________________ Billie Lade, Ph.D., M.D. JDK/MEDQ  D:  12/17/2010  T:  12/17/2010  Job:  [id number]

## 2010-12-18 ENCOUNTER — Ambulatory Visit
Admission: RE | Admit: 2010-12-18 | Discharge: 2010-12-18 | Disposition: A | Discharge status: Home / Self Care | Payer: BC Managed Care – PPO | Source: Ambulatory Visit | Attending: Radiation Oncology | Admitting: Radiation Oncology

## 2010-12-18 NOTE — Procedures (Signed)
DIAGNOSIS:  Right breast cancer.  NARRATIVE:  Earlier today Ms. Radin underwent film verification of her setup directed at the right breast area.  The patient's isocenter was actually placed and the multileaf collimators contoured the target volume accurately on all 3 reduced field photon beams.    ______________________________ Billie Lade, Ph.D., M.D. JDK/MEDQ  D:  12/18/2010  T:  12/18/2010  Job:  [id number]

## 2010-12-19 ENCOUNTER — Ambulatory Visit
Admission: RE | Admit: 2010-12-19 | Discharge: 2010-12-19 | Disposition: A | Discharge status: Home / Self Care | Payer: BC Managed Care – PPO | Source: Ambulatory Visit | Attending: Radiation Oncology | Admitting: Radiation Oncology

## 2010-12-21 ENCOUNTER — Ambulatory Visit: Payer: BC Managed Care – PPO

## 2010-12-24 ENCOUNTER — Ambulatory Visit
Admission: RE | Admit: 2010-12-24 | Discharge: 2010-12-24 | Disposition: A | Discharge status: Home / Self Care | Payer: BC Managed Care – PPO | Source: Ambulatory Visit | Attending: Radiation Oncology | Admitting: Radiation Oncology

## 2010-12-25 ENCOUNTER — Ambulatory Visit
Admission: RE | Admit: 2010-12-25 | Discharge: 2010-12-25 | Disposition: A | Discharge status: Home / Self Care | Payer: BC Managed Care – PPO | Source: Ambulatory Visit | Attending: Radiation Oncology | Admitting: Radiation Oncology

## 2010-12-25 ENCOUNTER — Telehealth: Payer: Self-pay | Admitting: Family Medicine

## 2010-12-26 ENCOUNTER — Other Ambulatory Visit: Payer: Self-pay | Admitting: *Deleted

## 2010-12-26 ENCOUNTER — Ambulatory Visit
Admission: RE | Admit: 2010-12-26 | Discharge: 2010-12-26 | Disposition: A | Discharge status: Home / Self Care | Payer: BC Managed Care – PPO | Source: Ambulatory Visit | Attending: Radiation Oncology | Admitting: Radiation Oncology

## 2010-12-26 DIAGNOSIS — E785 Hyperlipidemia, unspecified: Secondary | ICD-10-CM

## 2010-12-26 MED ORDER — ATORVASTATIN CALCIUM 20 MG PO TABS: 20.0000 mg | ORAL_TABLET | Freq: Every day | ORAL | Status: DC

## 2010-12-27 ENCOUNTER — Ambulatory Visit
Admission: RE | Admit: 2010-12-27 | Discharge: 2010-12-27 | Disposition: A | Discharge status: Home / Self Care | Payer: BC Managed Care – PPO | Source: Ambulatory Visit | Attending: Radiation Oncology | Admitting: Radiation Oncology

## 2010-12-27 VITALS — Wt 155.1 lb

## 2010-12-27 DIAGNOSIS — C50919 Malignant neoplasm of unspecified site of unspecified female breast: Secondary | ICD-10-CM

## 2010-12-27 NOTE — Progress Notes (Signed)
Weekly Management Note:  Site:R Breast Current Dose:  5400  cGy Projected Dose: 6300  cGy  Narrative: The patient is seen today for routine under treatment assessment. CBCT/MVCT images/port films were reviewed. The chart was reviewed.   She has moderate discomfort along the inframammary region.  Physical Examination: There were no vitals filed for this visit..  Weight: 155 lb 1.6 oz (70.353 kg). On inspection of the right breast there is linear moist desquamation along her partial mastectomy scar and also a 3-4 cm area of superficial moist desquamation along the inframammary region.  Impression: Tolerating radiation therapy well although she does have focal moist desquamation.  Plan: Continue radiation therapy as planned. She will start antibiotic ointment along her inframammary region.

## 2010-12-27 NOTE — Progress Notes (Signed)
Under right breat fold, skin broken, and in incision area top mid breast, bright erhthema, pt using biafine crram and neosporin , pain ful at times, not taking any meds, 4 days off had helped stated pt,"not as bad,is healing" 8:34 AM

## 2010-12-28 ENCOUNTER — Encounter: Payer: Self-pay | Admitting: *Deleted

## 2010-12-28 ENCOUNTER — Telehealth: Payer: Self-pay | Admitting: Oncology

## 2010-12-28 ENCOUNTER — Ambulatory Visit
Admission: RE | Admit: 2010-12-28 | Discharge: 2010-12-28 | Disposition: A | Discharge status: Home / Self Care | Payer: BC Managed Care – PPO | Source: Ambulatory Visit | Attending: Radiation Oncology | Admitting: Radiation Oncology

## 2010-12-28 NOTE — Telephone Encounter (Signed)
DONE

## 2010-12-28 NOTE — Telephone Encounter (Signed)
called pt lmovm to rtn call to schedule appt 

## 2010-12-31 ENCOUNTER — Telehealth: Payer: Self-pay | Admitting: Oncology

## 2010-12-31 ENCOUNTER — Ambulatory Visit
Admission: RE | Admit: 2010-12-31 | Discharge: 2010-12-31 | Disposition: A | Discharge status: Home / Self Care | Payer: BC Managed Care – PPO | Source: Ambulatory Visit | Attending: Radiation Oncology | Admitting: Radiation Oncology

## 2010-12-31 NOTE — Telephone Encounter (Signed)
called pts home to schedule appts for jan2013.  will mail appts to pts home for 2013

## 2011-01-01 ENCOUNTER — Ambulatory Visit
Admission: RE | Admit: 2011-01-01 | Discharge: 2011-01-01 | Disposition: A | Discharge status: Home / Self Care | Payer: BC Managed Care – PPO | Source: Ambulatory Visit | Attending: Radiation Oncology | Admitting: Radiation Oncology

## 2011-01-01 VITALS — Wt 155.2 lb

## 2011-01-01 DIAGNOSIS — C50919 Malignant neoplasm of unspecified site of unspecified female breast: Secondary | ICD-10-CM

## 2011-01-01 NOTE — Progress Notes (Signed)
Pt has 2 more txs., using biafine and stopped neosporin under right breast fold, stated"that healed it and it has stopped hurting", still raw looking and dryness around nipple and incision soreness/tenderness, no other c/o 8:40 AM

## 2011-01-01 NOTE — Progress Notes (Signed)
DIAGNOSIS:  Right breast cancer.  NARRATIVE:  Ms. Friebel is seen today for weekly assessment.  She has completed 5940 cGy of a planned 6300 cGy directed at the right breast area.  The patient does have some soreness in the breast area but denies any significant fatigue or pruritus.  The patient continues with her regular schedule and has not had to cut back on any activities.  This has been placing Neosporin ointment in the inframammary fold area, and overall, she feels this has improved.  EXAMINATION:  Lungs:  The lungs are clear.  Heart:  The heart has a regular rhythm and rate.  Breasts:  Examination of the right breast area reveals some swelling and erythema consistent with edema and radiation effect.  There are no obvious signs of infection in the breast.  There is no moist desquamation noted in the treatment area.  IMPRESSION AND PLAN:  The patient is tolerating her treatments well at this time.  The patient's radiation fields are setting up accurately. The patient's radiation chart was checked today.  The plan is to continue with 2 additional treatments to complete the patient's planned course of therapy.    ______________________________ Billie Lade, Ph.D., M.D. JDK/MEDQ  D:  01/01/2011  T:  01/01/2011  Job:  [id number]

## 2011-01-02 ENCOUNTER — Ambulatory Visit
Admission: RE | Admit: 2011-01-02 | Discharge: 2011-01-02 | Disposition: A | Discharge status: Home / Self Care | Payer: BC Managed Care – PPO | Source: Ambulatory Visit | Attending: Radiation Oncology | Admitting: Radiation Oncology

## 2011-01-02 ENCOUNTER — Telehealth: Payer: Self-pay | Admitting: Oncology

## 2011-01-02 NOTE — Telephone Encounter (Signed)
pt called and confirmed appt for 02/25/2011

## 2011-01-03 ENCOUNTER — Telehealth: Payer: Self-pay | Admitting: Internal Medicine

## 2011-01-03 ENCOUNTER — Ambulatory Visit
Admission: RE | Admit: 2011-01-03 | Discharge: 2011-01-03 | Disposition: A | Discharge status: Home / Self Care | Payer: BC Managed Care – PPO | Source: Ambulatory Visit | Attending: Radiation Oncology | Admitting: Radiation Oncology

## 2011-01-03 MED ORDER — ALENDRONATE SODIUM 70 MG PO TABS: 70.0000 mg | ORAL_TABLET | ORAL | Status: DC

## 2011-01-03 NOTE — Telephone Encounter (Signed)
Med renewed

## 2011-01-13 NOTE — Progress Notes (Signed)
CC:   [name], MD [name], M.D. [name], M.D.  DIAGNOSIS:  Right breast cancer.  INDICATION FOR THERAPY:  Breast conservation.  TREATMENT DATES:  November 15, 2010 through January 03, 2011.  SITES/DOSE:  Right breast 5040 cGy in 28 fractions (180 cGy per fraction).  The site of presentation in the upper outer quadrant of the right breast area was boosted further to a cumulative dose of 6300 cGy.  ENERGY/FIELD:  The patient was initially treated with tangential beams encompassing the right breast.  Forward planning was used to improve the dose homogeneity.  The patient was treated with A combination of 6 and 10 MV photons.  After 28 treatments, the patient underwent therapy directed at the site of presentation in the upper outer quadrant of the right breast.  Given the depth within the breast area a reduced field photon beam arrangement was chosen for treatment.  The patient was treated with 3 separate fields using a combination of 6 and 10 MV photons.  The patient received 7 additional treatments at 180 cGy per day for an additional dose of 1260 cGy and a cumulative dose to the target area of 6300 cGy.  NARRATIVE:  Mrs. Varney tolerated her treatments reasonably well. Towards end of her therapy she did develop some discomfort and pruritus within the breast.  In addition, the patient did develop some moist desquamation in the inframammary fold area.  This did respond to triple antibiotic ointments.  FOLLOW UP APPOINTMENT:  One month.    ______________________________ Billie Lade, Ph.D., M.D. JDK/MEDQ  D:  01/13/2011  T:  01/13/2011  Job:  [id number]

## 2011-02-01 ENCOUNTER — Encounter: Payer: Self-pay | Admitting: Internal Medicine

## 2011-02-04 ENCOUNTER — Telehealth: Payer: Self-pay | Admitting: Family Medicine

## 2011-02-04 NOTE — Telephone Encounter (Signed)
Patient is scheduled for  CPE on Monday 02/11/11, she is coming in this Wednesday for her labs for her physical. Can you put orders in for which labs you would like done

## 2011-02-04 NOTE — Telephone Encounter (Signed)
Chart is not in file. I don't believe I have seen this patient (per computer).  I usually do NOT order labs ahead of time on patients that I haven't seen before.  I would need her paper chart to review to see which labs are needed.  Please have someone bring me paper chart for my review on Wednesday morning in order to determine which labs are needed.

## 2011-02-05 NOTE — Telephone Encounter (Signed)
Her chart was already pulled for her Physical on Monday. She saw you once on 05/07/10 for an acute visit and before that her last Physical was with Selena Batten 12/06/09. Is it still all right for her to come have labs drawn tomorrow morning or do you prefer that she wait until you seen her for Physical

## 2011-02-05 NOTE — Telephone Encounter (Signed)
Since she is a 9am physical why doesn't she just come fasting to visit on Monday.  If she prefers them to be done ahead of time so she doesn't need to be fasting for visit, then I need to review her chart first thing tomorrow to let you know what needs to be done (ie. I know she needs lipids, but don't know about TSH, Vitamin D, etc.).  It is always hard ordering labs in advance when they haven't been seen recently, as if they have a complaint (ie fatigue, dizziness, etc.) then I may not be ordering the proper labs in advance for their complaints.  If they aren't having problems, then I can figure out which tests are needing by looking at her paper chart.

## 2011-02-06 ENCOUNTER — Other Ambulatory Visit: Payer: BC Managed Care – PPO

## 2011-02-06 ENCOUNTER — Other Ambulatory Visit: Payer: Self-pay | Admitting: *Deleted

## 2011-02-06 DIAGNOSIS — Z79899 Other long term (current) drug therapy: Secondary | ICD-10-CM

## 2011-02-06 DIAGNOSIS — E7800 Pure hypercholesterolemia, unspecified: Secondary | ICD-10-CM

## 2011-02-06 DIAGNOSIS — E042 Nontoxic multinodular goiter: Secondary | ICD-10-CM

## 2011-02-07 LAB — COMPREHENSIVE METABOLIC PANEL
ALT: 36 U/L — ABNORMAL HIGH (ref 0–35)
AST: 24 U/L (ref 0–37)
Calcium: 9.7 mg/dL (ref 8.4–10.5)
Chloride: 103 mEq/L (ref 96–112)
Creat: 0.93 mg/dL (ref 0.50–1.10)
Sodium: 139 mEq/L (ref 135–145)
Total Bilirubin: 0.6 mg/dL (ref 0.3–1.2)

## 2011-02-07 LAB — TSH: TSH: 0.659 u[IU]/mL (ref 0.350–4.500)

## 2011-02-07 LAB — LIPID PANEL
Total CHOL/HDL Ratio: 2.5 Ratio
VLDL: 15 mg/dL (ref 0–40)

## 2011-02-11 ENCOUNTER — Encounter: Payer: Self-pay | Admitting: Family Medicine

## 2011-02-11 ENCOUNTER — Other Ambulatory Visit (HOSPITAL_COMMUNITY)
Admission: RE | Admit: 2011-02-11 | Discharge: 2011-02-11 | Disposition: A | Discharge status: Home / Self Care | Payer: BC Managed Care – PPO | Source: Ambulatory Visit | Attending: Family Medicine | Admitting: Family Medicine

## 2011-02-11 ENCOUNTER — Ambulatory Visit (INDEPENDENT_AMBULATORY_CARE_PROVIDER_SITE_OTHER): Payer: BC Managed Care – PPO | Admitting: Family Medicine

## 2011-02-11 VITALS — BP 144/88 | HR 64 | Ht 63.0 in | Wt 154.0 lb

## 2011-02-11 DIAGNOSIS — M8580 Other specified disorders of bone density and structure, unspecified site: Secondary | ICD-10-CM | POA: Insufficient documentation

## 2011-02-11 DIAGNOSIS — Z0000 Encounter for general adult medical examination without abnormal findings: Secondary | ICD-10-CM

## 2011-02-11 DIAGNOSIS — C50919 Malignant neoplasm of unspecified site of unspecified female breast: Secondary | ICD-10-CM

## 2011-02-11 DIAGNOSIS — E7800 Pure hypercholesterolemia, unspecified: Secondary | ICD-10-CM

## 2011-02-11 DIAGNOSIS — Z01419 Encounter for gynecological examination (general) (routine) without abnormal findings: Secondary | ICD-10-CM | POA: Insufficient documentation

## 2011-02-11 DIAGNOSIS — M899 Disorder of bone, unspecified: Secondary | ICD-10-CM

## 2011-02-11 DIAGNOSIS — E785 Hyperlipidemia, unspecified: Secondary | ICD-10-CM

## 2011-02-11 DIAGNOSIS — Z23 Encounter for immunization: Secondary | ICD-10-CM

## 2011-02-11 DIAGNOSIS — M949 Disorder of cartilage, unspecified: Secondary | ICD-10-CM

## 2011-02-11 LAB — POCT URINALYSIS DIPSTICK
Ketones, UA: NEGATIVE
Protein, UA: NEGATIVE
Spec Grav, UA: 1.025
pH, UA: 5

## 2011-02-11 MED ORDER — ATORVASTATIN CALCIUM 20 MG PO TABS: 20.0000 mg | ORAL_TABLET | Freq: Every day | ORAL | Status: DC

## 2011-02-11 NOTE — Patient Instructions (Signed)

## 2011-02-11 NOTE — Progress Notes (Signed)
Tamara Evans is a 66 y.o. female who presents for a complete physical.  She has the following concerns: Needs pap today.  Wondering if she still needs to take alendronate--has been on it since 2007.  She recently was diagnosed and treated for R breast cancer.  Seeing Dr. Welton Flakes, and plans to start Letrozole in the near future  Immunization History  Administered Date(s) Administered  . DT 07/04/1994, 05/22/2004  . Influenza Split 12/15/2000, 10/29/2010  . Pneumococcal Polysaccharide 09/03/2007  . Tdap 02/11/2011  . Zoster 09/08/2007   Last Pap smear: 8/09 Last mammogram: 08/2010--cancer (R breast); UTD Last colonoscopy: 12/03 Dr. Madilyn Fireman Last DEXA: 12/2009 [facility] Dentist: once yearly, scheduled Ophtho: once yearly, scheduled Exercise: goes to rec center 2-3x/week, exercising for 45-60 minutes, and walks some.   Past Medical History  Diagnosis Date  . Hyperlipidemia   . Breast cancer     right breast stage I, er/pr positive, invasive ductal CA. Treated with lumpectomy and radiation  . PMS (premenstrual syndrome)   . Fibrocystic breast disease   . Menopause   . Allergy     perennial  . Osteopenia 6/07    L-spine  . Multinodular goiter   . Goiter, nodular 05/06    managed by Dr. Juleen China    Past Surgical History  Procedure Date  . Cervical polypectomy     in the patient's 20's  . Breast lumpectomy 10/05/10    lumpectomy and sentinel node biopsy (Right)    History   Social History  . Marital Status: Single    Spouse Name: N/A    Number of Children: 0  . Years of Education: N/A   Occupational History  . LIBRARIAN    Social History Main Topics  . Smoking status: Never Smoker   . Smokeless tobacco: Never Used  . Alcohol Use: 0.0 oz/week     3-4 glasses wine per month.  . Drug Use: No  . Sexually Active: Not Currently   Other Topics Concern  . Not on file   Social History Narrative   Lives alone, 1 cat    Family History  Problem Relation Age of Onset  . Cancer  Maternal Grandfather     70's, ? type (smoker)  . Diabetes Maternal Grandfather   . Cancer Paternal Grandfather   . Dementia Mother   . Osteoporosis Mother   . Heart disease Mother     pacemaker  . Diabetes Mother   . Hypertension Father   . Hyperlipidemia Father   . Hyperlipidemia Brother   . Thyroid disease Paternal Grandmother     goiter    Current outpatient prescriptions:alendronate (FOSAMAX) 70 MG tablet, Take 1 tablet (70 mg total) by mouth every 7 (seven) days. Take with a full glass of water on an empty stomach., Disp: 12 tablet, Rfl: 0;  aspirin 81 MG tablet, Take 81 mg by mouth daily.  , Disp: , Rfl: ;  atorvastatin (LIPITOR) 20 MG tablet, Take 1 tablet (20 mg total) by mouth daily., Disp: 90 tablet, Rfl: 3 calcium carbonate (OS-CAL) 600 MG TABS, Take 600 mg by mouth 2 (two) times daily with a meal.  , Disp: , Rfl: ;  fish oil-omega-3 fatty acids 1000 MG capsule, Take 1 g by mouth daily.  , Disp: , Rfl: ;  levothyroxine (SYNTHROID) 88 MCG tablet, Take 88 mcg by mouth daily., Disp: , Rfl: ;  Multiple Vitamin (MULTIVITAMIN) tablet, Take 1 tablet by mouth daily.  , Disp: , Rfl:   Allergies  Allergen Reactions  . Codeine Shortness Of Breath   ROS:  The patient denies anorexia, fever, weight changes, headaches,  vision changes, decreased hearing, ear pain, sore throat, chest pain, palpitations, dizziness, syncope, dyspnea on exertion, cough, swelling, nausea, vomiting, diarrhea, constipation, abdominal pain, melena, hematochezia, indigestion/heartburn, hematuria, incontinence, dysuria, vaginal bleeding, discharge, odor or itch, genital lesions, joint pains, numbness, tingling, weakness, tremor, suspicious skin lesions, depression, anxiety, abnormal bleeding/bruising, or enlarged lymph nodes.  PHYSICAL EXAM: BP 144/88  Pulse 64  Ht 5\' 3"  (1.6 m)  Wt 154 lb (69.854 kg)  BMI 27.28 kg/m2  General Appearance:    Alert, cooperative, no distress, appears stated age  Head:     Normocephalic, without obvious abnormality, atraumatic  Eyes:    PERRL, conjunctiva/corneas clear, EOM's intact, fundi    benign  Ears:    Normal TM's and external ear canals  Nose:   Nares normal, mucosa normal, no drainage or sinus   tenderness  Throat:   Lips, mucosa, and tongue normal; teeth and gums normal  Neck:   Supple, no lymphadenopathy;  thyroid:  no   enlargement/tenderness/nodules; no carotid   bruit or JVD  Back:    Spine nontender, no curvature, ROM normal, no CVA     tenderness  Lungs:     Clear to auscultation bilaterally without wheezes, rales or     ronchi; respirations unlabored  Chest Wall:    No tenderness or deformity   Heart:    Regular rate and rhythm, S1 and S2 normal, no murmur, rub   or gallop  Breast Exam:    No tenderness, masses, or nipple discharge or inversion.      No axillary lymphadenopathy. Right breast--WHSS, and firmness around nipple (related to radiation changes per pt, gradually improving, softening)  Abdomen:     Soft, non-tender, nondistended, normoactive bowel sounds,    no masses, no hepatosplenomegaly  Genitalia:    Normal external genitalia without lesions.  BUS and vagina normal; cervix without lesions, or cervical motion tenderness. No abnormal vaginal discharge.  Uterus and adnexa not enlarged, nontender, no masses.  Pap performed  Rectal:    Normal tone, no masses or tenderness; guaiac negative stool  Extremities:   No clubbing, cyanosis or edema  Pulses:   2+ and symmetric all extremities  Skin:   Skin color, texture, turgor normal, no rashes or lesions  Lymph nodes:   Cervical, supraclavicular, and axillary nodes normal  Neurologic:   CNII-XII intact, normal strength, sensation and gait; reflexes 2+ and symmetric throughout          Psych:   Normal mood, affect, hygiene and grooming.    ASSESSMENT/PLAN:  1. Routine general medical examination at a health care facility  POCT Urinalysis Dipstick, Visual acuity screening, Cytology - PAP    2. Dyslipidemia  atorvastatin (LIPITOR) 20 MG tablet  3. Pure hypercholesterolemia    4. Osteopenia    5. Need for Tdap vaccination  Tdap vaccine greater than or equal to 7yo IM  6. Breast cancer     Elevated BP today. Continue to monitor elsewhere. Weight loss recommended, low sodium diet, daily exercise. Hyperlipidemia--well controlled with Lipitor. Continue med and lowfat, low cholesterol diet. Osteopenia--continue alendronate and address with Dr. Welton Flakes.  Given last T score of -2, I recommend continuing med, re-checking DEXA next year (2 years from last).  Discussed monthly self breast exams and yearly mammograms after the age of 27; at least 30 minutes of aerobic activity at least 5  days/week; proper sunscreen use reviewed; healthy diet, including goals of calcium and vitamin D intake and alcohol recommendations (less than or equal to 1 drink/day) reviewed; regular seatbelt use; changing batteries in smoke detectors.  Immunization recommendations discussed.  Colonoscopy recommendations reviewed  Colonoscopy due 12/2011; TdaP given today.

## 2011-02-12 ENCOUNTER — Ambulatory Visit (INDEPENDENT_AMBULATORY_CARE_PROVIDER_SITE_OTHER): Payer: BC Managed Care – PPO | Admitting: General Surgery

## 2011-02-12 ENCOUNTER — Encounter (INDEPENDENT_AMBULATORY_CARE_PROVIDER_SITE_OTHER): Payer: Self-pay | Admitting: General Surgery

## 2011-02-12 VITALS — BP 132/88 | HR 88 | Temp 97.2°F | Ht 63.0 in | Wt 157.4 lb

## 2011-02-12 DIAGNOSIS — Z853 Personal history of malignant neoplasm of breast: Secondary | ICD-10-CM

## 2011-02-12 NOTE — Progress Notes (Signed)
Subjective:     Patient ID: Tamara Evans, female   DOB: 01-Dec-1945, 66 y.o.   MRN: [id number]  HPI This is a 66 year old female who is status post a right breast lumpectomy and sentinel node biopsy. This was ER positive at 100%, PR-positive 40%, and HER-2/neu was negative. She completed her radiation therapy at this point. She returns today doing well without any significant complaints. She is back to most of her normal activity but at this point. She still is sore in her right breast occasionally but otherwise feels no masses and has no concerning areas.  Review of Systems     Objective:   Physical Exam  Constitutional: She appears well-developed and well-nourished.  Neck: Neck supple.  Pulmonary/Chest: Right breast exhibits tenderness (mildly tender throughout). Right breast exhibits no inverted nipple, no mass, no nipple discharge and no skin change. Left breast exhibits no inverted nipple, no mass, no nipple discharge, no skin change and no tenderness. Breasts are symmetrical.    Lymphadenopathy:    She has no cervical adenopathy.    She has no axillary adenopathy.       Right: No supraclavicular adenopathy present.       Left: No supraclavicular adenopathy present.       Assessment:     History of right breast cancer     Plan:     Referral to PT class She is at full activity No evidence recurrence Due to see Dr. Welton Flakes for antiestrogen therapy initiation MMG in August RTC in one year

## 2011-02-13 ENCOUNTER — Other Ambulatory Visit: Payer: BC Managed Care – PPO

## 2011-02-14 ENCOUNTER — Encounter: Payer: Self-pay | Admitting: Family Medicine

## 2011-02-19 ENCOUNTER — Telehealth: Payer: Self-pay | Admitting: Oncology

## 2011-02-19 NOTE — Telephone Encounter (Signed)
Pt called to cancel her lab appt since she had labs done on 02/06/2011 at [facility]

## 2011-02-25 ENCOUNTER — Ambulatory Visit (HOSPITAL_BASED_OUTPATIENT_CLINIC_OR_DEPARTMENT_OTHER): Payer: BC Managed Care – PPO | Admitting: Oncology

## 2011-02-25 ENCOUNTER — Ambulatory Visit
Admission: RE | Admit: 2011-02-25 | Discharge: 2011-02-25 | Disposition: A | Discharge status: Home / Self Care | Payer: BC Managed Care – PPO | Source: Ambulatory Visit | Attending: Radiation Oncology | Admitting: Radiation Oncology

## 2011-02-25 ENCOUNTER — Other Ambulatory Visit: Payer: BC Managed Care – PPO | Admitting: Lab

## 2011-02-25 VITALS — BP 122/77 | HR 76 | Temp 97.9°F | Ht 63.0 in | Wt 156.5 lb

## 2011-02-25 DIAGNOSIS — C50919 Malignant neoplasm of unspecified site of unspecified female breast: Secondary | ICD-10-CM

## 2011-02-25 MED ORDER — LETROZOLE 2.5 MG PO TABS: 2.5000 mg | ORAL_TABLET | Freq: Every day | ORAL | Status: AC

## 2011-02-25 MED ORDER — VITAMIN D 1000 UNITS PO TABS: 1000.0000 [IU] | ORAL_TABLET | Freq: Every day | ORAL | Status: AC

## 2011-02-25 NOTE — Progress Notes (Signed)
OFFICE PROGRESS NOTE  CC  KNAPP,[name], MD, MD [address] [place] [address] Dr. Antony Blackbird Dr. Emelia Loron  DIAGNOSIS: 66 year old female with invasive mammary carcinoma of the right breast diagnosed 09/18/2010.  PRIOR THERAPY:#1 right breast lumpectomy that revealed a 0.8 cm invasive ductal carcinoma grade 2. 2 sentinel nodes were negative for metastatic disease. The tumor was ER +100% PR +40% proliferation marker 64% with HER-2/neu not amplified with a ratio of 1.07.  #3 patient is status post radiation therapy administered by Dr. Fayrene Fearing kinder.  #3 patient had an Oncotype DX performed that showed a recurrence score of 23 putting her in the intermediate risk category with a 5 year risk of distant recurrence at about sent with tamoxifen therapy.  CURRENT THERAPY:To begin letrozole 2.5 mg daily.  INTERVAL HISTORY: Tamara Evans 66 y.o. female returns for Followup visit today. Overall she is doing well. She has completed her radiation therapy. She overall tolerated it very well without any significant change skin changes. She denies any fevers chills night sweats headaches shortness of breath chest pains palpitations she has no nausea or vomiting. She denies having any myalgias or arthralgias. Patient does complain of having a little bit of a pulling sensation in the side right side do to those surgery calls scar. She does occasionally have trying some pain as well. Remainder of the 10 point review of systems is negative.  MEDICAL HISTORY: Past Medical History  Diagnosis Date  . Hyperlipidemia   . Breast cancer     right breast stage I, er/pr positive, invasive ductal CA. Treated with lumpectomy and radiation  . PMS (premenstrual syndrome)   . Fibrocystic breast disease   . Menopause   . Allergy     perennial  . Osteopenia 6/07    L-spine  . Multinodular goiter   . Goiter, nodular 05/06    managed by Dr. Juleen China    ALLERGIES:  is allergic to  codeine.  MEDICATIONS:  Current Outpatient Prescriptions  Medication Sig Dispense Refill  . alendronate (FOSAMAX) 70 MG tablet Take 1 tablet (70 mg total) by mouth every 7 (seven) days. Take with a full glass of water on an empty stomach.  12 tablet  0  . aspirin 81 MG tablet Take 81 mg by mouth daily.        Marland Kitchen atorvastatin (LIPITOR) 20 MG tablet Take 1 tablet (20 mg total) by mouth daily.  90 tablet  3  . calcium carbonate (OS-CAL) 600 MG TABS Take 600 mg by mouth 2 (two) times daily with a meal.        . fish oil-omega-3 fatty acids 1000 MG capsule Take 1 g by mouth daily.        Marland Kitchen levothyroxine (SYNTHROID) 88 MCG tablet Take 88 mcg by mouth daily.      . Multiple Vitamin (MULTIVITAMIN) tablet Take 1 tablet by mouth daily.        . cholecalciferol (VITAMIN D) 1000 UNITS tablet Take 1 tablet (1,000 Units total) by mouth daily.  90 tablet  12  . letrozole (FEMARA) 2.5 MG tablet Take 1 tablet (2.5 mg total) by mouth daily.  90 tablet  12    SURGICAL HISTORY:  Past Surgical History  Procedure Date  . Cervical polypectomy     in the patient's 20's  . Breast lumpectomy 10/05/10    lumpectomy and sentinel node biopsy (Right)    REVIEW OF SYSTEMS:  Pertinent items are noted in HPI.   PHYSICAL  EXAMINATION: General appearance: alert, cooperative and appears stated age Head: Normocephalic, without obvious abnormality, atraumatic Neck: no adenopathy, no carotid bruit, no JVD, supple, symmetrical, trachea midline and thyroid not enlarged, symmetric, no tenderness/mass/nodules Lymph nodes: Cervical, supraclavicular, and axillary nodes normal. Resp: clear to auscultation bilaterally and normal percussion bilaterally Back: symmetric, no curvature. ROM normal. No CVA tenderness. Cardio: regular rate and rhythm, S1, S2 normal, no murmur, click, rub or gallop and normal apical impulse GI: soft, non-tender; bowel sounds normal; no masses,  no organomegaly Extremities: extremities normal, atraumatic,  no cyanosis or edema Neurologic: Alert and oriented X 3, normal strength and tone. Normal symmetric reflexes. Normal coordination and gait Breast examination: Right breast no masses nipple discharge or skin changes. Right breast reveals a well-healed surgical scar. There is some fullness in this region but no palpable masses. There are no skin changes no nipple discharge retraction or inversion. ECOG PERFORMANCE STATUS: 1 - Symptomatic but completely ambulatory  Blood pressure 122/77, pulse 76, temperature 97.9 F (36.6 C), temperature source Oral, height 5\' 3"  (1.6 m), weight 156 lb 8 oz (70.988 kg).  LABORATORY DATA: Lab Results  Component Value Date   WBC 6.2 10/03/2010   HGB 15.2* 10/03/2010   HCT 45.0 10/03/2010   MCV 79.6 10/03/2010   PLT 199 10/03/2010      Chemistry      Component Value Date/Time   NA 139 02/06/2011 0909   K 4.7 02/06/2011 0909   CL 103 02/06/2011 0909   CO2 25 02/06/2011 0909   BUN 29* 02/06/2011 0909   CREATININE 0.93 02/06/2011 0909   CREATININE 0.76 10/03/2010 0832      Component Value Date/Time   CALCIUM 9.7 02/06/2011 0909   ALKPHOS 82 02/06/2011 0909   AST 24 02/06/2011 0909   ALT 36* 02/06/2011 0909   BILITOT 0.6 02/06/2011 0909       RADIOGRAPHIC STUDIES:  No results found.  ASSESSMENT: D31-year-old female with  #1 invasive ductal carcinoma that was grade 2 status post right breast lumpectomy with sentinel node biopsy 10/05/10. The final pathology revealed a 0.8 cm invasive ductal carcinoma 2 sentinel nodes were negative for metastatic disease. Tumor was ER positive PR positive HER-2/neu negative with a proliferation marker 64%.  #2 patient is now status post radiation therapy and overall she tolerated it well.   PLAN:  #1 we will start her on adjuvant endocrine therapy consisting of letrozole 2.5 mg daily. Risks and benefits of the treatment were discussed with the patient and literature has been given to her  .#2 patient does have history of osteopenia/osteoporosis  she is on Fosamax. I have recommended that she begin vitamin D3 and a prescription has been sent to her pharmacy as well.  #3 patient will be seen back in about 3 months time for followup. We did discuss exercise eating healthy. She knows to call me with any problems questions or concerns.   All questions were answered. The patient knows to call the clinic with any problems, questions or concerns. We can certainly see the patient much sooner if necessary.  I spent 20 minutes counseling the patient face to face. The total time spent in the appointment was 30 minutes.    [name], MD Medical/Oncology [facility] [phone] (beeper) [phone] (Office)  02/25/2011, 10:57 AM

## 2011-02-25 NOTE — Patient Instructions (Signed)
Letrozole tablets What is this medicine? LETROZOLE (LET roe zole) blocks the production of estrogen. Certain types of breast cancer grow under the influence of estrogen. Letrozole helps block tumor growth. This medicine is used to treat advanced breast cancer in postmenopausal women. This medicine may be used for other purposes; ask your health care provider or pharmacist if you have questions. What should I tell my health care provider before I take this medicine? They need to know if you have any of these conditions: -liver disease -osteoporosis (weak bones) -an unusual or allergic reaction to letrozole, other medicines, foods, dyes, or preservatives -pregnant or trying to get pregnant -breast-feeding How should I use this medicine? Take this medicine by mouth with a glass of water. You may take it with or without food. Follow the directions on the prescription label. Take your medicine at regular intervals. Do not take your medicine more often than directed. Do not stop taking except on your doctor's advice. Talk to your pediatrician regarding the use of this medicine in children. Special care may be needed. Overdosage: If you think you have taken too much of this medicine contact a poison control center or emergency room at once. NOTE: This medicine is only for you. Do not share this medicine with others. What if I miss a dose? If you miss a dose, take it as soon as you can. If it is almost time for your next dose, take only that dose. Do not take double or extra doses. What may interact with this medicine? Do not take this medicine with any of the following medications: -estrogens, like hormone replacement therapy or birth control pills This medicine may also interact with the following medications: -dietary supplements such as androstenedione or DHEA -prasterone -tamoxifen This list may not describe all possible interactions. Give your health care provider a list of all the medicines,  herbs, non-prescription drugs, or dietary supplements you use. Also tell them if you smoke, drink alcohol, or use illegal drugs. Some items may interact with your medicine. What should I watch for while using this medicine? Visit your doctor or health care professional for regular check-ups to monitor your condition. Do not use this drug if you are pregnant. Serious side effects to an unborn child are possible. Talk to your doctor or pharmacist for more information. You may get drowsy or dizzy. Do not drive, use machinery, or do anything that needs mental alertness until you know how this medicine affects you. Do not stand or sit up quickly, especially if you are an older patient. This reduces the risk of dizzy or fainting spells. What side effects may I notice from receiving this medicine? Side effects that you should report to your doctor or health care professional as soon as possible: -allergic reactions like skin rash, itching, or hives -bone fracture -chest pain -difficulty breathing or shortness of breath -severe pain, swelling, warmth in the leg -unusually weak or tired -vaginal bleeding Side effects that usually do not require medical attention (report to your doctor or health care professional if they continue or are bothersome): -bone, back, joint, or muscle pain -dizziness -fatigue -fluid retention -headache -hot flashes, night sweats -nausea -weight gain This list may not describe all possible side effects. Call your doctor for medical advice about side effects. You may report side effects to FDA at 1-800-FDA-1088. Where should I keep my medicine? Keep out of the reach of children. Store between 15 and 30 degrees C (59 and 86 degrees F). Throw away   any unused medicine after the expiration date. NOTE: This sheet is a summary. It may not cover all possible information. If you have questions about this medicine, talk to your doctor, pharmacist, or health care provider.  2012,  Elsevier/Gold Standard. (03/27/2007 4:43:44 PM) 

## 2011-02-25 NOTE — Progress Notes (Signed)
CC:   [name], M.D. [name], M.D. [name], MD  DIAGNOSIS:  Right breast cancer.  INTERVAL SINCE RADIATION THERAPY:  6 weeks.  NARRATIVE:  Tamara Evans comes in today for routine followup.  She clinically seems to be doing well at this time.  She denies any pain within the breast area, nipple discharge or bleeding.  She has noticed some firmness to the upper aspect of her breast.  The patient denies any fatigue at this time.  She denies any problems with swelling in her right arm or hand.  PHYSICAL EXAMINATION:  The patient's temperature is 97.9, pulse 76, respirations 20, blood pressure is 122/77, weight is 156.2 pounds. Examination of the neck and supraclavicular region reveals no evidence of adenopathy.  The axillary areas are free of adenopathy.  Examination of the lungs reveals them to be clear.  The heart has a regular rhythm and rate.  Examination of the left breast reveals no mass or nipple discharge.  Examination of the right breast reveals some mild hyperpigmentation changes.  There continues to be a fair amount of edema in the breast.  There is no dominant mass appreciated in the breast, nipple discharge or bleeding.  IMPRESSION/PLAN:  Clinically NED (no evidence of disease).  The patient will return for routine followup in 6 months.  The patient did meet with Dr. Welton Flakes earlier today and the patient has been given a prescription for vitamin D and letrozole.    ______________________________ Billie Lade, Ph.D., M.D. JDK/MEDQ  D:  02/25/2011  T:  02/25/2011  Job:  [id number]

## 2011-02-25 NOTE — Progress Notes (Signed)
Here today for routine follow up post 1 month radiation to right breast. Discoloration has cleared. New medication prescribed today by Dr. Park Breed vit. D and letrozole. Denies pain.No concerns voiced.

## 2011-02-26 NOTE — Progress Notes (Signed)
error 

## 2011-03-25 ENCOUNTER — Other Ambulatory Visit: Payer: Self-pay | Admitting: Endocrinology

## 2011-03-25 DIAGNOSIS — E041 Nontoxic single thyroid nodule: Secondary | ICD-10-CM

## 2011-04-01 ENCOUNTER — Other Ambulatory Visit: Payer: Self-pay | Admitting: Family Medicine

## 2011-04-01 NOTE — Telephone Encounter (Signed)
Is this ok?

## 2011-04-01 NOTE — Telephone Encounter (Signed)
done

## 2011-05-20 ENCOUNTER — Encounter: Payer: Self-pay | Admitting: Oncology

## 2011-05-20 ENCOUNTER — Ambulatory Visit (HOSPITAL_BASED_OUTPATIENT_CLINIC_OR_DEPARTMENT_OTHER): Payer: BC Managed Care – PPO | Admitting: Oncology

## 2011-05-20 ENCOUNTER — Other Ambulatory Visit (HOSPITAL_BASED_OUTPATIENT_CLINIC_OR_DEPARTMENT_OTHER): Payer: BC Managed Care – PPO | Admitting: Lab

## 2011-05-20 ENCOUNTER — Telehealth: Payer: Self-pay | Admitting: *Deleted

## 2011-05-20 VITALS — BP 138/84 | HR 70 | Temp 97.9°F | Ht 63.0 in | Wt 157.1 lb

## 2011-05-20 DIAGNOSIS — C50919 Malignant neoplasm of unspecified site of unspecified female breast: Secondary | ICD-10-CM

## 2011-05-20 DIAGNOSIS — C50419 Malignant neoplasm of upper-outer quadrant of unspecified female breast: Secondary | ICD-10-CM

## 2011-05-20 DIAGNOSIS — Z170 Estrogen receptor positive status [ER+]: Secondary | ICD-10-CM

## 2011-05-20 DIAGNOSIS — M810 Age-related osteoporosis without current pathological fracture: Secondary | ICD-10-CM

## 2011-05-20 LAB — CBC WITH DIFFERENTIAL/PLATELET
BASO%: 0.5 % (ref 0.0–2.0)
EOS%: 3.7 % (ref 0.0–7.0)
HCT: 42.1 % (ref 34.8–46.6)
LYMPH%: 22.5 % (ref 14.0–49.7)
MCH: 26.6 pg (ref 25.1–34.0)
MCHC: 33 g/dL (ref 31.5–36.0)
NEUT%: 63.5 % (ref 38.4–76.8)
Platelets: 177 10*3/uL (ref 145–400)

## 2011-05-20 LAB — COMPREHENSIVE METABOLIC PANEL
ALT: 24 U/L (ref 0–35)
Albumin: 4 g/dL (ref 3.5–5.2)
Chloride: 106 mEq/L (ref 96–112)
Glucose, Bld: 111 mg/dL — ABNORMAL HIGH (ref 70–99)
Total Bilirubin: 0.5 mg/dL (ref 0.3–1.2)
Total Protein: 6.2 g/dL (ref 6.0–8.3)

## 2011-05-20 NOTE — Patient Instructions (Signed)
1. Doing well.  2. I will see you back in 6 months with blood work  3. Please have pharmacy send [place] a request if you need a refill for the letrozole  4. Call with any problems in the meantime

## 2011-05-20 NOTE — Telephone Encounter (Signed)
per orders from 05-20-2011 gave patient appointment for 11-28-2011 starting at 9:00am printed out calendar and gave to the patient

## 2011-05-20 NOTE — Progress Notes (Signed)
OFFICE PROGRESS NOTE  CC  KNAPP,[name], MD, MD [address] [place] [address] Dr. Antony Blackbird Dr. Emelia Loron  DIAGNOSIS: 66 year old female with invasive mammary carcinoma of the right breast diagnosed 09/18/2010.  PRIOR THERAPY:#1 right breast lumpectomy that revealed a 0.8 cm invasive ductal carcinoma grade 2. 2 sentinel nodes were negative for metastatic disease. The tumor was ER +100% PR +40% proliferation marker 64% with HER-2/neu not amplified with a ratio of 1.07.  #3 patient is status post radiation therapy administered by Dr. Fayrene Fearing kinder.  #3 patient had an Oncotype DX performed that showed a recurrence score of 23 putting her in the intermediate risk category with a 5 year risk of distant recurrence at about sent with tamoxifen therapy.  CURRENT THERAPY: letrozole 2.5 mg daily.  INTERVAL HISTORY: Tamara Evans 67 y.o. female returns for Followup visit today. Overall she's doing well she is tolerating the letrozole. She denies any fevers chills night sweats headaches no shortness of breath chest pains palpitations she does occasionally have hot flashes but to a week. She does have some aches and pains but she has been gardening quite a bit this spring. She has not had any weight loss. She is trying to exercise and eat healthy. She continues to work full-time. Remainder of the 10 point review of systems is negative.   MEDICAL HISTORY: Past Medical History  Diagnosis Date  . Hyperlipidemia   . Breast cancer     right breast stage I, er/pr positive, invasive ductal CA. Treated with lumpectomy and radiation  . PMS (premenstrual syndrome)   . Fibrocystic breast disease   . Menopause   . Allergy     perennial  . Osteopenia 6/07    L-spine  . Multinodular goiter   . Goiter, nodular 05/06    managed by Dr. Juleen China    ALLERGIES:  is allergic to codeine.  MEDICATIONS:  Current Outpatient Prescriptions  Medication Sig Dispense Refill  . alendronate  (FOSAMAX) 70 MG tablet TAKE 1 TABLET BY MOUTH EVERY 7 DAYS. TAKE WITH A FULL GLASS OF WATERON AN EMPTY STOMACH.  12 tablet  1  . aspirin 81 MG tablet Take 81 mg by mouth daily.        Marland Kitchen atorvastatin (LIPITOR) 20 MG tablet Take 1 tablet (20 mg total) by mouth daily.  90 tablet  3  . calcium carbonate (OS-CAL) 600 MG TABS Take 600 mg by mouth 2 (two) times daily with a meal.        . cholecalciferol (VITAMIN D) 1000 UNITS tablet Take 1 tablet (1,000 Units total) by mouth daily.  90 tablet  12  . fish oil-omega-3 fatty acids 1000 MG capsule Take 1 g by mouth daily.        Marland Kitchen letrozole (FEMARA) 2.5 MG tablet Take 2.5 mg by mouth daily.      Marland Kitchen levothyroxine (SYNTHROID) 88 MCG tablet Take 88 mcg by mouth daily.      . Multiple Vitamin (MULTIVITAMIN) tablet Take 1 tablet by mouth daily.          SURGICAL HISTORY:  Past Surgical History  Procedure Date  . Cervical polypectomy     in the patient's 20's  . Breast lumpectomy 10/05/10    lumpectomy and sentinel node biopsy (Right)    REVIEW OF SYSTEMS:  Pertinent items are noted in HPI.   PHYSICAL EXAMINATION: General appearance: alert, cooperative and appears stated age Head: Normocephalic, without obvious abnormality, atraumatic Neck: no adenopathy, no carotid  bruit, no JVD, supple, symmetrical, trachea midline and thyroid not enlarged, symmetric, no tenderness/mass/nodules Lymph nodes: Cervical, supraclavicular, and axillary nodes normal. Resp: clear to auscultation bilaterally and normal percussion bilaterally Back: symmetric, no curvature. ROM normal. No CVA tenderness. Cardio: regular rate and rhythm, S1, S2 normal, no murmur, click, rub or gallop and normal apical impulse GI: soft, non-tender; bowel sounds normal; no masses,  no organomegaly Extremities: extremities normal, atraumatic, no cyanosis or edema Neurologic: Alert and oriented X 3, normal strength and tone. Normal symmetric reflexes. Normal coordination and gait Breast examination:  Right breast no masses nipple discharge or skin changes. Right breast reveals a well-healed surgical scar. There is some fullness in this region but no palpable masses. There are no skin changes no nipple discharge retraction or inversion. ECOG PERFORMANCE STATUS: 1 - Symptomatic but completely ambulatory  Blood pressure 138/84, pulse 70, temperature 97.9 F (36.6 C), height 5\' 3"  (1.6 m), weight 157 lb 1.6 oz (71.26 kg).  LABORATORY DATA: Lab Results  Component Value Date   WBC 3.7* 05/20/2011   HGB 13.9 05/20/2011   HCT 42.1 05/20/2011   MCV 80.6 05/20/2011   PLT 177 05/20/2011       RADIOGRAPHIC STUDIES:  No results found.  ASSESSMENT: D86-year-old female with  #1 invasive ductal carcinoma that was grade 2 status post right breast lumpectomy with sentinel node biopsy 10/05/10. The final pathology revealed a 0.8 cm invasive ductal carcinoma 2 sentinel nodes were negative for metastatic disease. Tumor was ER positive PR positive HER-2/neu negative with a proliferation marker 64%.  #2 patient is now status post radiation therapy and overall she tolerated it well.  #3 On Letrozole 2.5 mg daily since January 2013   PLAN:  #1 She is on adjuvant endocrine therapy consisting of letrozole 2.5 mg daily. Risks and benefits of the treatment were discussed with the patient and literature has been given to her  .#2 patient does have history of osteopenia/osteoporosis she is on Fosamax. I have recommended that she begin vitamin D3 and a prescription has been sent to her pharmacy as well.  #3 patient will be seen back in about 6 months time for followup. We did discuss exercise eating healthy. She knows to call me with any problems questions or concerns.   All questions were answered. The patient knows to call the clinic with any problems, questions or concerns. We can certainly see the patient much sooner if necessary.  I spent 30 minutes counseling the patient face to face. The total time  spent in the appointment was 30 minutes.    [name], MD Medical/Oncology [facility] [phone] (beeper) [phone] (Office)  05/20/2011, 9:56 AM

## 2011-05-23 ENCOUNTER — Encounter: Payer: Self-pay | Admitting: Endocrinology

## 2011-07-01 ENCOUNTER — Encounter: Payer: Self-pay | Admitting: Family Medicine

## 2011-07-01 ENCOUNTER — Ambulatory Visit (INDEPENDENT_AMBULATORY_CARE_PROVIDER_SITE_OTHER): Payer: BC Managed Care – PPO | Admitting: Family Medicine

## 2011-07-01 VITALS — BP 140/88 | HR 88 | Temp 98.3°F | Ht 63.0 in | Wt 154.0 lb

## 2011-07-01 DIAGNOSIS — J069 Acute upper respiratory infection, unspecified: Secondary | ICD-10-CM

## 2011-07-01 MED ORDER — AMOXICILLIN 500 MG PO CAPS: 1000.0000 mg | ORAL_CAPSULE | Freq: Two times a day (BID) | ORAL | Status: AC

## 2011-07-01 NOTE — Progress Notes (Signed)
Chief Complaint  Patient presents with  . Sore Throat    that worsens as day goes on. Also by the end of the day she has swollen glands, HA and sinus pain/pressure and runny nose x 7 days.   HPI: Started 1 week ago with sore throat and malaise, then progressed to sinus pressure, headaches, swollen glands.  Nasal mucus is clear.  + cough, some postnasal drainage. By the end of the day she has a lowgrade fever to 99.5, swollen glands.  Has been using OTC daytime medications (sinus) with no improvement.  Also tried a warming fluid for sore throat which didn't help.  Ibuprofen has helped some, until it wore off.  Illness began while in [place].  She has a h/o getting sick after flying. +exposure to 65 year old triplets, who had just been getting over an illness.  Past Medical History  Diagnosis Date  . Hyperlipidemia   . Breast cancer     right breast stage I, er/pr positive, invasive ductal CA. Treated with lumpectomy and radiation  . PMS (premenstrual syndrome)   . Fibrocystic breast disease   . Menopause   . Allergy     perennial  . Osteopenia 6/07    L-spine  . Multinodular goiter   . Goiter, nodular 05/06    managed by Dr. Juleen China   Past Surgical History  Procedure Date  . Cervical polypectomy     in the patient's 65's  . Breast lumpectomy 10/05/10    lumpectomy and sentinel node biopsy (Right)   History   Social History  . Marital Status: Single    Spouse Name: N/A    Number of Children: 0  . Years of Education: N/A   Occupational History  . LIBRARIAN    Social History Main Topics  . Smoking status: Never Smoker   . Smokeless tobacco: Never Used  . Alcohol Use: 0.0 oz/week     3-4 glasses wine per month.  . Drug Use: No  . Sexually Active: Not Currently   Other Topics Concern  . Not on file   Social History Narrative   Lives alone, 1 cat   Current Outpatient Prescriptions on File Prior to Visit  Medication Sig Dispense Refill  . alendronate (FOSAMAX) 70 MG tablet  TAKE 1 TABLET BY MOUTH EVERY 7 DAYS. TAKE WITH A FULL GLASS OF WATERON AN EMPTY STOMACH.  12 tablet  1  . aspirin 81 MG tablet Take 81 mg by mouth daily.        Marland Kitchen atorvastatin (LIPITOR) 20 MG tablet Take 1 tablet (20 mg total) by mouth daily.  90 tablet  3  . calcium carbonate (OS-CAL) 600 MG TABS Take 600 mg by mouth 2 (two) times daily with a meal.        . cholecalciferol (VITAMIN D) 1000 UNITS tablet Take 1 tablet (1,000 Units total) by mouth daily.  90 tablet  12  . fish oil-omega-3 fatty acids 1000 MG capsule Take 1 g by mouth daily.        Marland Kitchen letrozole (FEMARA) 2.5 MG tablet Take 2.5 mg by mouth daily.      Marland Kitchen levothyroxine (SYNTHROID) 88 MCG tablet Take 88 mcg by mouth daily.      . Multiple Vitamin (MULTIVITAMIN) tablet Take 1 tablet by mouth daily.         Allergies  Allergen Reactions  . Codeine Shortness Of Breath   ROS:  Denies nausea, vomiting, diarrhea, skin rash, shortness of breath, chest  pain.  BP 140/88  Pulse 88  Temp(Src) 98.3 F (36.8 C) (Oral)  Ht 5\' 3"  (1.6 m)  Wt 154 lb (69.854 kg)  BMI 27.28 kg/m2  Well developed female, with occasional dry cough, hoarse voice, sounds congested.  In no distress HEENT:  PERRL, EOMI, conjunctiva clear.  TM's and EAC's normal. Nasal mucosa mildly edematous, no purulence.  Sinuses nontender.  OP--erythema posteriorly, some cobblestoning. Neck: no lymphadenopathy Heart: regular rate and rhythm without murmur Lungs: clear bilaterally Skin: no rash Psych: normal mood, affect, hygiene, grooming.  Appears tired  ASSESSMENT/PLAN: 1. URI (upper respiratory infection)  amoxicillin (AMOXIL) 500 MG capsule   URI--likely viral.  Discussed decongestants, expectorants, sinus rinses.  If symptoms persist (ie lowgrade fevers, develops discolored mucus/phlegm, worsening sinus pain), then start antibiotics

## 2011-07-01 NOTE — Patient Instructions (Signed)
URI--likely viral.  Discussed decongestants (phenylephrine or pseudoephedrine), expectorants (guaifenesin--I like the 12 hour mucinex), sinus rinses.  If symptoms persist (ie lowgrade fevers, develops discolored mucus/phlegm, worsening sinus pain), then start antibiotics.  Remember that if you start the antibiotics, you must finish them.  It might a good 5-7 days for you to feel better after starting the antibiotics

## 2011-08-26 ENCOUNTER — Encounter: Payer: Self-pay | Admitting: Radiation Oncology

## 2011-08-26 ENCOUNTER — Ambulatory Visit
Admission: RE | Admit: 2011-08-26 | Discharge: 2011-08-26 | Disposition: A | Discharge status: Home / Self Care | Payer: BC Managed Care – PPO | Source: Ambulatory Visit | Attending: Radiation Oncology | Admitting: Radiation Oncology

## 2011-08-26 VITALS — BP 131/85 | HR 73 | Temp 98.0°F | Resp 20 | Wt 158.0 lb

## 2011-08-26 DIAGNOSIS — Z923 Personal history of irradiation: Secondary | ICD-10-CM | POA: Insufficient documentation

## 2011-08-26 DIAGNOSIS — C50919 Malignant neoplasm of unspecified site of unspecified female breast: Secondary | ICD-10-CM

## 2011-08-26 HISTORY — DX: Personal history of irradiation: Z92.3

## 2011-08-26 NOTE — Progress Notes (Signed)
  Radiation Oncology         [phone] ________________________________  Name: Tamara Evans MRN: [id number]  Date: 08/26/2011  DOB: [birth year]  Follow-Up Visit Note  CC: Lavonda Jumbo, MD  Emelia Loron, MD  Diagnosis:   Stage I right breast  Interval Since Last Radiation:  7 months  Narrative:  The patient returns today for routine follow-up.  She is doing well except for this mild discomfort in the right axillary area. She attributes this to muscle strain. Patient denies any swelling in her right arm or hand. She denies any discomfort in the right breast nipple discharge or bleeding.  she is on letrozole and seems to be tolerating this well.                              ALLERGIES:  is allergic to codeine.  Meds: Current Outpatient Prescriptions  Medication Sig Dispense Refill  . alendronate (FOSAMAX) 70 MG tablet TAKE 1 TABLET BY MOUTH EVERY 7 DAYS. TAKE WITH A FULL GLASS OF WATERON AN EMPTY STOMACH.  12 tablet  1  . aspirin 81 MG tablet Take 81 mg by mouth daily.        Marland Kitchen atorvastatin (LIPITOR) 20 MG tablet Take 1 tablet (20 mg total) by mouth daily.  90 tablet  3  . calcium carbonate (OS-CAL) 600 MG TABS Take 600 mg by mouth 2 (two) times daily with a meal.        . cholecalciferol (VITAMIN D) 1000 UNITS tablet Take 1 tablet (1,000 Units total) by mouth daily.  90 tablet  12  . fish oil-omega-3 fatty acids 1000 MG capsule Take 1 g by mouth daily.        Marland Kitchen letrozole (FEMARA) 2.5 MG tablet Take 2.5 mg by mouth daily.      Marland Kitchen levothyroxine (SYNTHROID) 88 MCG tablet Take 88 mcg by mouth daily.      . Multiple Vitamin (MULTIVITAMIN) tablet Take 1 tablet by mouth daily.          Physical Findings: The patient is in no acute distress. Patient is alert and oriented.  weight is 158 lb (71.668 kg). Her oral temperature is 98 F (36.7 C). Her blood pressure is 131/85 and her pulse is 73. Her respiration is 20. Marland Kitchen  No palpable cervical subclavicular or axillary adenopathy. Lungs  clear to auscultation. The heart has regular rhythm and rate. The left breast shows no mass or nipple discharge. The right breast shows some continued edema since her radiation therapy.   there is no dominant mass appreciated breast nipple discharge or bleeding.  Lab Findings: Lab Results  Component Value Date   WBC 3.7* 05/20/2011   HGB 13.9 05/20/2011   HCT 42.1 05/20/2011   MCV 80.6 05/20/2011   PLT 177 05/20/2011    @LASTCHEM @  Radiographic Findings: No results found.  Impression:  The patient is recovering from the effects of radiation.  No evidence of recurrence on clinical exam today.  Plan:  When necessary followup. The patient will continue close followup with Dr. Welton Flakes.  _____________________________________    Billie Lade, PhD, MD

## 2011-08-26 NOTE — Progress Notes (Addendum)
Pt states she was exercising regularly, took several weeks off then began again . She states she "pulled something in her right axilla while exercising". Area has been sore past few days but improving per pt. Pt states she received card stating her mammogram is due in Aug. She has not scheduled yet. Denies fatigue, loss of appetite. Pt's right eye red; she states she has "poor blood vessels, gets red eye periodically, is not painful".

## 2011-09-23 ENCOUNTER — Other Ambulatory Visit: Payer: Self-pay | Admitting: Medical Oncology

## 2011-09-28 ENCOUNTER — Other Ambulatory Visit: Payer: Self-pay | Admitting: Family Medicine

## 2011-10-17 ENCOUNTER — Other Ambulatory Visit: Payer: Self-pay | Admitting: Family Medicine

## 2011-10-17 DIAGNOSIS — Z9889 Other specified postprocedural states: Secondary | ICD-10-CM

## 2011-10-17 DIAGNOSIS — C50911 Malignant neoplasm of unspecified site of right female breast: Secondary | ICD-10-CM

## 2011-10-17 DIAGNOSIS — Z853 Personal history of malignant neoplasm of breast: Secondary | ICD-10-CM

## 2011-10-25 ENCOUNTER — Ambulatory Visit
Admission: RE | Admit: 2011-10-25 | Discharge: 2011-10-25 | Disposition: A | Discharge status: Home / Self Care | Payer: BC Managed Care – PPO | Source: Ambulatory Visit | Attending: Family Medicine | Admitting: Family Medicine

## 2011-10-25 DIAGNOSIS — Z853 Personal history of malignant neoplasm of breast: Secondary | ICD-10-CM

## 2011-10-25 DIAGNOSIS — Z9889 Other specified postprocedural states: Secondary | ICD-10-CM

## 2011-10-25 DIAGNOSIS — C50911 Malignant neoplasm of unspecified site of right female breast: Secondary | ICD-10-CM

## 2011-10-31 ENCOUNTER — Telehealth: Payer: Self-pay | Admitting: *Deleted

## 2011-10-31 NOTE — Telephone Encounter (Signed)
Message copied by Cooper Render on Thu Oct 31, 2011  9:38 AM ------      Message from: Victorino December      Created: Tue Oct 29, 2011  3:39 PM       Call patient: no evidence of breast cancer recurrence in mammo

## 2011-10-31 NOTE — Telephone Encounter (Signed)
Called pt per MD, unable to leave message.

## 2011-11-26 ENCOUNTER — Telehealth: Payer: Self-pay | Admitting: *Deleted

## 2011-11-26 NOTE — Telephone Encounter (Signed)
Please move Lab/ MD appt on 10/31 to 11/5 MD @ 10am or 11/12 MD@ 0930 with labs prior. Please call pt confirm new date/time  Left voice message to inform the appointment for the 12-10-2011 at 9:00am

## 2011-11-26 NOTE — Telephone Encounter (Signed)
appt for 10/31 to be moved per MD. Called lmovm for pt to call back

## 2011-11-28 ENCOUNTER — Ambulatory Visit: Payer: BC Managed Care – PPO | Admitting: Oncology

## 2011-11-28 ENCOUNTER — Other Ambulatory Visit: Payer: BC Managed Care – PPO | Admitting: Lab

## 2011-12-09 ENCOUNTER — Ambulatory Visit: Payer: BC Managed Care – PPO | Admitting: Oncology

## 2011-12-10 ENCOUNTER — Other Ambulatory Visit (HOSPITAL_BASED_OUTPATIENT_CLINIC_OR_DEPARTMENT_OTHER): Payer: BC Managed Care – PPO | Admitting: Lab

## 2011-12-10 ENCOUNTER — Encounter: Payer: Self-pay | Admitting: Adult Health

## 2011-12-10 ENCOUNTER — Ambulatory Visit (HOSPITAL_BASED_OUTPATIENT_CLINIC_OR_DEPARTMENT_OTHER): Payer: BC Managed Care – PPO | Admitting: Adult Health

## 2011-12-10 ENCOUNTER — Telehealth: Payer: Self-pay | Admitting: *Deleted

## 2011-12-10 VITALS — BP 117/77 | HR 74 | Temp 98.3°F | Resp 20 | Ht 63.0 in | Wt 158.1 lb

## 2011-12-10 DIAGNOSIS — Z79811 Long term (current) use of aromatase inhibitors: Secondary | ICD-10-CM

## 2011-12-10 DIAGNOSIS — M810 Age-related osteoporosis without current pathological fracture: Secondary | ICD-10-CM | POA: Diagnosis not present

## 2011-12-10 DIAGNOSIS — C50419 Malignant neoplasm of upper-outer quadrant of unspecified female breast: Secondary | ICD-10-CM | POA: Diagnosis not present

## 2011-12-10 DIAGNOSIS — Z780 Asymptomatic menopausal state: Secondary | ICD-10-CM

## 2011-12-10 DIAGNOSIS — M8580 Other specified disorders of bone density and structure, unspecified site: Secondary | ICD-10-CM

## 2011-12-10 DIAGNOSIS — C50919 Malignant neoplasm of unspecified site of unspecified female breast: Secondary | ICD-10-CM

## 2011-12-10 LAB — CBC WITH DIFFERENTIAL/PLATELET
BASO%: 0.7 % (ref 0.0–2.0)
LYMPH%: 18.4 % (ref 14.0–49.7)
MCHC: 34.1 g/dL (ref 31.5–36.0)
MCV: 80 fL (ref 79.5–101.0)
MONO%: 7.7 % (ref 0.0–14.0)
Platelets: 186 10*3/uL (ref 145–400)
RBC: 5.56 10*6/uL — ABNORMAL HIGH (ref 3.70–5.45)
RDW: 13.3 % (ref 11.2–14.5)
WBC: 5.1 10*3/uL (ref 3.9–10.3)

## 2011-12-10 LAB — COMPREHENSIVE METABOLIC PANEL (CC13)
ALT: 41 U/L (ref 0–55)
AST: 25 U/L (ref 5–34)
Alkaline Phosphatase: 76 U/L (ref 40–150)
Potassium: 3.7 mEq/L (ref 3.5–5.1)
Sodium: 142 mEq/L (ref 136–145)
Total Bilirubin: 0.64 mg/dL (ref 0.20–1.20)
Total Protein: 7.1 g/dL (ref 6.4–8.3)

## 2011-12-10 MED ORDER — LETROZOLE 2.5 MG PO TABS: 2.5000 mg | ORAL_TABLET | Freq: Every day | ORAL | Status: DC

## 2011-12-10 NOTE — Progress Notes (Signed)
OFFICE PROGRESS NOTE  CC  KNAPP,[name], MD [address] [place] [address] Dr. Antony Blackbird Dr. Emelia Loron  DIAGNOSIS: 66 year old female with invasive mammary carcinoma of the right breast diagnosed 09/18/2010.  PRIOR THERAPY:#1 right breast lumpectomy that revealed a 0.8 cm invasive ductal carcinoma grade 2. 2 sentinel nodes were negative for metastatic disease. The tumor was ER +100% PR +40% proliferation marker 64% with HER-2/neu not amplified with a ratio of 1.07.  #3 patient is status post radiation therapy administered by Dr. Antony Blackbird.  #3 patient had an Oncotype DX performed that showed a recurrence score of 23 putting her in the intermediate risk category with a 5 year risk of distant recurrence at about sent with tamoxifen therapy.  CURRENT THERAPY: letrozole 2.5 mg daily.  INTERVAL HISTORY: Tamara Evans 66 y.o. female returns for follow up.  She is feeling well.  She has developed joint pains over the past couple of months, but is otherwise tolerating her Letrozole well.  She does go to the gym a couple of times per week.    MEDICAL HISTORY: Past Medical History  Diagnosis Date  . Hyperlipidemia   . PMS (premenstrual syndrome)   . Fibrocystic breast disease   . Menopause   . Allergy     perennial  . Osteopenia 6/07    L-spine  . Multinodular goiter   . Goiter, nodular 05/06    managed by Dr. Juleen China  . Breast cancer     right breast stage I, er/pr positive, invasive ductal CA. Treated with lumpectomy and radiation  . Hx of radiation therapy     right breast    ALLERGIES:  is allergic to codeine.  MEDICATIONS:  Current Outpatient Prescriptions  Medication Sig Dispense Refill  . alendronate (FOSAMAX) 70 MG tablet TAKE 1 TAB BY MOUTH EVERY 7 DAYS. TAKE WITH A FULL GLASS OF WATER ANDTAKE ON EMPTY STOMACH.  12 tablet  0  . aspirin 81 MG tablet Take 81 mg by mouth daily.        Marland Kitchen atorvastatin (LIPITOR) 20 MG tablet Take 1 tablet (20 mg  total) by mouth daily.  90 tablet  3  . calcium carbonate (OS-CAL) 600 MG TABS Take 600 mg by mouth 2 (two) times daily with a meal.        . cholecalciferol (VITAMIN D) 1000 UNITS tablet Take 1 tablet (1,000 Units total) by mouth daily.  90 tablet  12  . fish oil-omega-3 fatty acids 1000 MG capsule Take 1 g by mouth daily.        Marland Kitchen letrozole (FEMARA) 2.5 MG tablet Take 2.5 mg by mouth daily.      Marland Kitchen levothyroxine (SYNTHROID) 88 MCG tablet Take 88 mcg by mouth daily.      . Multiple Vitamin (MULTIVITAMIN) tablet Take 1 tablet by mouth daily.          SURGICAL HISTORY:  Past Surgical History  Procedure Date  . Cervical polypectomy     in the patient's 20's  . Breast lumpectomy 10/05/10    lumpectomy and sentinel node biopsy (Right)    REVIEW OF SYSTEMS:   General: fatigue (-), night sweats (-), fever (-), pain (-) Lymph: palpable nodes (-) HEENT: vision changes (-), mucositis (-), gum bleeding (-), epistaxis (-) Cardiovascular: chest pain (-), palpitations (-) Pulmonary: shortness of breath (-), dyspnea on exertion (-), cough (-), hemoptysis (-) GI:  Early satiety (-), melena (-), dysphagia (-), nausea/vomiting (-), diarrhea (-) GU: dysuria (-),  hematuria (-), incontinence (-) Musculoskeletal: joint swelling (-), joint pain (-), back pain (-) Neuro: weakness (-), numbness (-), headache (-), confusion (-) Skin: Rash (-), lesions (-), dryness (-) Psych: depression (-), suicidal/homicidal ideation (-), feeling of hopelessness (-)   Health Maintenance  Mammogram: 09/2011 Colonoscopy: 2003 Bone Density Scan: 12/2009 Pap Smear: 01/2011 Eye Exam: 10/2011 Vitamin D Level: n/a Lipid Panel: 01/2011   PHYSICAL EXAMINATION: BP 117/77  Pulse 74  Temp 98.3 F (36.8 C) (Oral)  Resp 20  Ht 5\' 3"  (1.6 m)  Wt 158 lb 1.6 oz (71.714 kg)  BMI 28.01 kg/m2 General: Patient is a well appearing female in no acute distress HEENT: PERRLA, sclerae anicteric no conjunctival pallor, MMM Neck:  supple, no palpable adenopathy Lungs: clear to auscultation bilaterally, no wheezes, rhonchi, or rales Cardiovascular: regular rate rhythm, S1, S2, no murmurs, rubs or gallops Abdomen: Soft, non-tender, non-distended, normoactive bowel sounds, no HSM Extremities: warm and well perfused, no clubbing, cyanosis, or edema Skin: No rashes or lesions Neuro: Non-focal Breast examination: Right breast no masses nipple discharge or skin changes. Right breast reveals a well-healed surgical scar. There is some fullness in this region but no palpable masses. There are no skin changes no nipple discharge retraction or inversion. ECOG PERFORMANCE STATUS: 1 - Symptomatic but completely ambulatory  LABORATORY DATA: Lab Results  Component Value Date   WBC 5.1 12/10/2011   HGB 15.2 12/10/2011   HCT 44.5 12/10/2011   MCV 80.0 12/10/2011   PLT 186 12/10/2011       RADIOGRAPHIC STUDIES:  No results found.  ASSESSMENT: 66 year old female with  #1 invasive ductal carcinoma that was grade 2 status post right breast lumpectomy with sentinel node biopsy 10/05/10. The final pathology revealed a 0.8 cm invasive ductal carcinoma 2 sentinel nodes were negative for metastatic disease. Tumor was ER positive PR positive HER-2/neu negative with a proliferation marker 64%.  #2 patient is now status post radiation therapy and overall she tolerated it well.  #3 On Letrozole 2.5 mg daily since January 2013   PLAN:  #1 She is on adjuvant endocrine therapy consisting of letrozole 2.5 mg daily. She is tolerating this well.  She will continue this, there is no sign of recurrence.  We did discuss self breast exams and getting used to the new normal of her right breast.  We discussed continuing exercise and water aerobics.  Her health maintenance was reviewed.    #2 patient does have history of osteopenia/osteoporosis she is on Fosamax. She is due for a bone density exam in December, I have ordered it.    #3 patient will  be seen back in about 6 months time for followup. She knows to call us with any problems questions or concerns.   All questions were answered. The patient knows to call the clinic with any problems, questions or concerns. We can certainly see the patient much sooner if necessary.  I spent 25 minutes counseling the patient face to face. The total time spent in the appointment was 30 minutes.  This case was reviewed with Dr. Welton Flakes.  Cherie Ouch Lyn Hollingshead, NP Medical Oncology [facility] Phone: [phone]  12/10/2011, 9:53 AM

## 2011-12-10 NOTE — Telephone Encounter (Signed)
Gave patient appointment for bone density at [facility] on 01-06-2012 at 10:00am

## 2011-12-10 NOTE — Patient Instructions (Signed)
Try Vitamin B complex, and/or glucosamine chondroitin for joint pain.  Continue Arimidex.

## 2011-12-16 ENCOUNTER — Other Ambulatory Visit: Payer: Self-pay | Admitting: Family Medicine

## 2012-01-08 ENCOUNTER — Encounter: Payer: Self-pay | Admitting: Family Medicine

## 2012-01-09 ENCOUNTER — Encounter: Payer: Self-pay | Admitting: Internal Medicine

## 2012-03-02 ENCOUNTER — Ambulatory Visit
Admission: RE | Admit: 2012-03-02 | Discharge: 2012-03-02 | Disposition: A | Discharge status: Home / Self Care | Payer: BC Managed Care – PPO | Source: Ambulatory Visit | Attending: Endocrinology | Admitting: Endocrinology

## 2012-03-02 DIAGNOSIS — E041 Nontoxic single thyroid nodule: Secondary | ICD-10-CM

## 2012-03-20 ENCOUNTER — Other Ambulatory Visit: Payer: Self-pay | Admitting: Family Medicine

## 2012-03-20 ENCOUNTER — Other Ambulatory Visit: Payer: Self-pay

## 2012-03-20 DIAGNOSIS — E7800 Pure hypercholesterolemia, unspecified: Secondary | ICD-10-CM

## 2012-03-20 DIAGNOSIS — M8580 Other specified disorders of bone density and structure, unspecified site: Secondary | ICD-10-CM

## 2012-03-20 NOTE — Telephone Encounter (Signed)
HAS [facility] APPT  ON 04/02/12 AND WANTS TO COME IN BEFORE THAT FOR HER BLOOD WORK IF THAT IS OK?  PLEASE CALL PT

## 2012-03-20 NOTE — Telephone Encounter (Signed)
She will need a CBC, Cmet, TSH, vitamin D level, lipid

## 2012-03-20 NOTE — Telephone Encounter (Signed)
LEFT MESSAGE DR.KNAPP HAS APPROVED [facility] AND TO PLEASE CALL AND MAKE A NURSE VISIT APT

## 2012-03-20 NOTE — Telephone Encounter (Signed)
ORDERS IN  

## 2012-03-22 ENCOUNTER — Other Ambulatory Visit: Payer: Self-pay | Admitting: Family Medicine

## 2012-03-25 ENCOUNTER — Other Ambulatory Visit: Payer: BC Managed Care – PPO

## 2012-03-25 DIAGNOSIS — M8580 Other specified disorders of bone density and structure, unspecified site: Secondary | ICD-10-CM

## 2012-03-25 DIAGNOSIS — E7800 Pure hypercholesterolemia, unspecified: Secondary | ICD-10-CM

## 2012-03-25 LAB — CBC WITH DIFFERENTIAL/PLATELET
Basophils Relative: 0 % (ref 0–1)
Eosinophils Absolute: 0.1 10*3/uL (ref 0.0–0.7)
Eosinophils Relative: 3 % (ref 0–5)
HCT: 43.2 % (ref 36.0–46.0)
Hemoglobin: 14.7 g/dL (ref 12.0–15.0)
Lymphs Abs: 1.1 10*3/uL (ref 0.7–4.0)
MCH: 25.8 pg — ABNORMAL LOW (ref 26.0–34.0)
MCHC: 34 g/dL (ref 30.0–36.0)
MCV: 75.8 fL — ABNORMAL LOW (ref 78.0–100.0)
Monocytes Absolute: 0.6 10*3/uL (ref 0.1–1.0)
Monocytes Relative: 11 % (ref 3–12)
Neutrophils Relative %: 64 % (ref 43–77)

## 2012-03-25 LAB — LIPID PANEL
HDL: 66 mg/dL (ref >39)
LDL Cholesterol: 86 mg/dL (ref 0–99)
VLDL: 19 mg/dL (ref 0–40)

## 2012-03-25 LAB — COMPREHENSIVE METABOLIC PANEL
AST: 24 U/L (ref 0–37)
Albumin: 4.2 g/dL (ref 3.5–5.2)
Alkaline Phosphatase: 67 U/L (ref 39–117)
BUN: 29 mg/dL — ABNORMAL HIGH (ref 6–23)
Potassium: 3.9 mEq/L (ref 3.5–5.3)
Sodium: 140 mEq/L (ref 135–145)
Total Bilirubin: 0.5 mg/dL (ref 0.3–1.2)

## 2012-03-26 LAB — VITAMIN D 25 HYDROXY (VIT D DEFICIENCY, FRACTURES): Vit D, 25-Hydroxy: 53 ng/mL (ref 30–89)

## 2012-04-02 ENCOUNTER — Encounter: Payer: BC Managed Care – PPO | Admitting: Family Medicine

## 2012-04-02 ENCOUNTER — Ambulatory Visit (INDEPENDENT_AMBULATORY_CARE_PROVIDER_SITE_OTHER): Payer: BC Managed Care – PPO | Admitting: Family Medicine

## 2012-04-02 ENCOUNTER — Encounter: Payer: Self-pay | Admitting: Family Medicine

## 2012-04-02 ENCOUNTER — Ambulatory Visit
Admission: RE | Admit: 2012-04-02 | Discharge: 2012-04-02 | Disposition: A | Discharge status: Home / Self Care | Payer: BC Managed Care – PPO | Source: Ambulatory Visit | Attending: Family Medicine | Admitting: Family Medicine

## 2012-04-02 VITALS — BP 126/82 | HR 68 | Ht 63.0 in | Wt 157.0 lb

## 2012-04-02 DIAGNOSIS — Z0000 Encounter for general adult medical examination without abnormal findings: Secondary | ICD-10-CM

## 2012-04-02 DIAGNOSIS — E7800 Pure hypercholesterolemia, unspecified: Secondary | ICD-10-CM

## 2012-04-02 DIAGNOSIS — M25561 Pain in right knee: Secondary | ICD-10-CM

## 2012-04-02 DIAGNOSIS — E039 Hypothyroidism, unspecified: Secondary | ICD-10-CM

## 2012-04-02 DIAGNOSIS — E041 Nontoxic single thyroid nodule: Secondary | ICD-10-CM | POA: Insufficient documentation

## 2012-04-02 DIAGNOSIS — M899 Disorder of bone, unspecified: Secondary | ICD-10-CM

## 2012-04-02 DIAGNOSIS — Z923 Personal history of irradiation: Secondary | ICD-10-CM

## 2012-04-02 DIAGNOSIS — M8580 Other specified disorders of bone density and structure, unspecified site: Secondary | ICD-10-CM

## 2012-04-02 DIAGNOSIS — M25569 Pain in unspecified knee: Secondary | ICD-10-CM

## 2012-04-02 DIAGNOSIS — C50911 Malignant neoplasm of unspecified site of right female breast: Secondary | ICD-10-CM

## 2012-04-02 DIAGNOSIS — C50919 Malignant neoplasm of unspecified site of unspecified female breast: Secondary | ICD-10-CM

## 2012-04-02 LAB — POCT URINALYSIS DIPSTICK
Bilirubin, UA: NEGATIVE
Blood, UA: NEGATIVE
Ketones, UA: NEGATIVE
Protein, UA: NEGATIVE
Spec Grav, UA: 1.015
pH, UA: 6

## 2012-04-02 MED ORDER — ATORVASTATIN CALCIUM 20 MG PO TABS: ORAL_TABLET | ORAL | Status: DC

## 2012-04-02 MED ORDER — ALENDRONATE SODIUM 70 MG PO TABS: ORAL_TABLET | ORAL | Status: DC

## 2012-04-02 NOTE — Progress Notes (Signed)
Subjective:    Patient ID: Tamara Evans, female    DOB: April 11, 1945, 67 y.o.   MRN: [id number]  HPI He is here for complete examination. She does have an underlying history of breast cancer with radiation. She is also presently on Femara. She also has a history of osteopenia and is on a bisphosphonate. She also has hypothyroidism is taking Synthroid . She also takes Lipitor for her hyperlipidemia. She also complains of a approximately 6 month history of right knee swelling but no popping, locking or grinding. No history of injury to the knee. No other joints are involved.   Review of Systems  Constitutional: Negative.   HENT: Negative.   Respiratory: Negative.   Cardiovascular: Negative.   Gastrointestinal: Negative.   Endocrine: Negative.   Genitourinary: Negative.   Musculoskeletal: Positive for joint swelling.  Skin: Negative.   Allergic/Immunologic: Negative.   Neurological: Negative.   Hematological: Negative.   Psychiatric/Behavioral: Negative.        Objective:   Physical Exam BP 126/82  Pulse 68  Ht 5\' 3"  (1.6 m)  Wt 157 lb (71.215 kg)  BMI 27.82 kg/m2  General Appearance:    Alert, cooperative, no distress, appears stated age  Head:    Normocephalic, without obvious abnormality, atraumatic  Eyes:    PERRL, conjunctiva/corneas clear, EOM's intact, funduscopic exam showed normal on the right however left at 3:00 a blackish area that appeared on the posterior aspect of the cornea was noted.      Ears:    Normal TM's and external ear canals  Nose:   Nares normal, mucosa normal, no drainage or sinus   tenderness  Throat:   Lips, mucosa, and tongue normal; teeth and gums normal  Neck:   Supple, no lymphadenopathy;  thyroid:  no   enlargement/tenderness/nodules; no carotid   bruit or JVD  Back:    Spine nontender, no curvature, ROM normal, no CVA     tenderness  Lungs:     Clear to auscultation bilaterally without wheezes, rales or     ronchi; respirations unlabored   Chest Wall:    No tenderness or deformity   Heart:    Regular rate and rhythm, S1 and S2 normal, no murmur, rub   or gallop  Breast Exam:    No tenderness, masses, or nipple discharge or inversion.      No axillary lymphadenopathy  Abdomen:     Soft, non-tender, nondistended, normoactive bowel sounds,    no masses, no hepatosplenomegaly  Genitalia:    Normal external genitalia without lesions.  BUS and vagina normal; cervix without lesions, or cervical motion tenderness. No abnormal vaginal discharge.  Uterus and adnexa not enlarged, nontender, no masses.  Pap performed  Rectal:    Normal tone, no masses or tenderness; guaiac negative stool  Extremities:   No clubbing, cyanosis or edema. Knee exam showed an effusion. No tenderness to palpation. Anterior drawer and McMurray's testing was negative. X-ray was negative.  Pulses:   2+ and symmetric all extremities  Skin:   Skin color, texture, turgor normal, no rashes or lesions  Lymph nodes:   Cervical, supraclavicular, and axillary nodes normal  Neurologic:   CNII-XII intact, normal strength, sensation and gait; reflexes 2+ and symmetric throughout          Psych:   Normal mood, affect, hygiene and grooming.         Assessment & Plan:  Routine general medical examination at a health care facility - Plan: POCT  Urinalysis Dipstick, HM COLONOSCOPY  Knee pain, acute, right - Plan: DG Knee 1-2 Views Right  Osteopenia - Plan: alendronate (FOSAMAX) 70 MG tablet  Breast cancer, right  Pure hypercholesterolemia - Plan: atorvastatin (LIPITOR) 20 MG tablet  Hx of radiation therapy  Unspecified hypothyroidism she will return for followup on the knee and probable injection. She is to continue on all of her other medications. Recommend she followup with her ophthalmologist concerning the eye lesion.

## 2012-04-02 NOTE — Progress Notes (Signed)
Quick Note:  Let her know that the x-ray is negative and have her set up an appointment for joint injection. ______

## 2012-04-02 NOTE — Progress Notes (Signed)
Quick Note:  Left mesage for pt x-ray neg please call and make an apt for pos.inj ______

## 2012-04-14 ENCOUNTER — Ambulatory Visit: Payer: BC Managed Care – PPO | Admitting: Family Medicine

## 2012-04-16 ENCOUNTER — Ambulatory Visit (INDEPENDENT_AMBULATORY_CARE_PROVIDER_SITE_OTHER): Payer: BC Managed Care – PPO | Admitting: Family Medicine

## 2012-04-16 ENCOUNTER — Encounter: Payer: Self-pay | Admitting: Family Medicine

## 2012-04-16 DIAGNOSIS — M25461 Effusion, right knee: Secondary | ICD-10-CM

## 2012-04-16 DIAGNOSIS — M25469 Effusion, unspecified knee: Secondary | ICD-10-CM

## 2012-04-16 MED ORDER — TRIAMCINOLONE ACETONIDE 40 MG/ML IJ SUSP
40.0000 mg | Freq: Once | INTRAMUSCULAR | Status: AC
  Administered 2012-04-16: 40 mg via INTRAMUSCULAR

## 2012-04-16 MED ORDER — LIDOCAINE HCL (PF) 1 % IJ SOLN
3.0000 mL | Freq: Once | INTRAMUSCULAR | Status: AC
  Administered 2012-04-16: 3 mL via INTRADERMAL

## 2012-04-16 NOTE — Addendum Note (Signed)
Addended by: Ronnald Nian on: 04/16/2012 02:42 PM   Modules accepted: Orders

## 2012-04-16 NOTE — Progress Notes (Signed)
  Subjective:    Patient ID: Tamara Evans, female    DOB: [birth year], 67 y.o.   MRN: [id number]  HPI Is here for recheck on right knee effusion. Recent x-ray was negative. She continues to does have swelling but no popping, locking, grinding or other joints involved.   Review of Systems     Objective:   Physical Exam Right knee has a small effusion with no tenderness to palpation.       Assessment & Plan:  Knee effusion, right I discussed the risks and benefits with her. Since he doesn't seem to be any under lying major issue, I think an injection of Kenalog and Xylocaine is appropriate. The knee was prepped with Betadine. A superior lateral approach was taken. 7 cc of clear yellow fluid was removed and 3 cc of Xylocaine and 40 mg of Kenalog was injected without difficulty. Discussed the possibility of steroid flare with her. Fluid sent off for evaluation for crystals and blood count

## 2012-04-17 LAB — SYNOVIAL CELL COUNT + DIFF, W/ CRYSTALS
Crystals, Fluid: NONE SEEN
Eosinophils-Synovial: 0 % (ref 0–1)
Monocyte/Macrophage: 72 % (ref 50–90)

## 2012-04-20 NOTE — Progress Notes (Signed)
Quick Note:  CALLED PT HOME # LEFT MESSAGE   Let her know that the knee fluid evaluation was negative   ______

## 2012-05-20 ENCOUNTER — Other Ambulatory Visit: Payer: Self-pay | Admitting: *Deleted

## 2012-05-20 MED ORDER — LETROZOLE 2.5 MG PO TABS: 2.5000 mg | ORAL_TABLET | Freq: Every day | ORAL | Status: DC

## 2012-06-08 ENCOUNTER — Encounter: Payer: Self-pay | Admitting: Oncology

## 2012-06-08 ENCOUNTER — Ambulatory Visit (HOSPITAL_BASED_OUTPATIENT_CLINIC_OR_DEPARTMENT_OTHER): Payer: BC Managed Care – PPO | Admitting: Oncology

## 2012-06-08 ENCOUNTER — Other Ambulatory Visit: Payer: BC Managed Care – PPO | Admitting: Lab

## 2012-06-08 ENCOUNTER — Telehealth: Payer: Self-pay | Admitting: Oncology

## 2012-06-08 VITALS — BP 134/83 | HR 76 | Temp 98.4°F | Resp 20 | Ht 63.0 in | Wt 157.5 lb

## 2012-06-08 DIAGNOSIS — C50419 Malignant neoplasm of upper-outer quadrant of unspecified female breast: Secondary | ICD-10-CM

## 2012-06-08 DIAGNOSIS — C50911 Malignant neoplasm of unspecified site of right female breast: Secondary | ICD-10-CM

## 2012-06-08 MED ORDER — LETROZOLE 2.5 MG PO TABS: 2.5000 mg | ORAL_TABLET | Freq: Every day | ORAL | Status: DC

## 2012-06-08 NOTE — Patient Instructions (Addendum)
Continue letrozole 2.5 mg daily  I will see you back in 4 months.  We discussed use of prolia instead of fosamax for your bones.  Denosumab injection What is this medicine? DENOSUMAB slows bone breakdown. It is used to treat osteoporosis in women after menopause and in men. This medicine is also used to prevent bone fractures and other bone problems caused by cancer bone metastases. This medicine may be used for other purposes; ask your health care provider or pharmacist if you have questions. What should I tell my health care provider before I take this medicine? They need to know if you have any of these conditions: -dental disease -eczema -infection or history of infections -kidney disease or on dialysis -low blood calcium or vitamin D -malabsorption syndrome -scheduled to have surgery or tooth extraction -taking medicine that contains denosumab -thyroid or parathyroid disease -an unusual reaction to denosumab, other medicines, foods, dyes, or preservatives -pregnant or trying to get pregnant -breast-feeding How should I use this medicine? This medicine is for injection under the skin. It is given by a health care professional in a hospital or clinic setting. If you are getting Prolia, a special MedGuide will be given to you by the pharmacist with each prescription and refill. Be sure to read this information carefully each time. Talk to your pediatrician regarding the use of this medicine in children. Special care may be needed. Overdosage: If you think you've taken too much of this medicine contact a poison control center or emergency room at once. Overdosage: If you think you have taken too much of this medicine contact a poison control center or emergency room at once. NOTE: This medicine is only for you. Do not share this medicine with others. What if I miss a dose? It is important not to miss your dose. Call your doctor or health care professional if you are unable to keep an  appointment. What may interact with this medicine? Do not take this medicine with any of the following medications: -other medicines containing denosumab This medicine may also interact with the following medications: -medicines that suppress the immune system -medicines that treat cancer -steroid medicines like prednisone or cortisone This list may not describe all possible interactions. Give your health care provider a list of all the medicines, herbs, non-prescription drugs, or dietary supplements you use. Also tell them if you smoke, drink alcohol, or use illegal drugs. Some items may interact with your medicine. What should I watch for while using this medicine? Visit your doctor or health care professional for regular checks on your progress. Your doctor or health care professional may order blood tests and other tests to see how you are doing. Call your doctor or health care professional if you get a cold or other infection while receiving this medicine. Do not treat yourself. This medicine may decrease your body's ability to fight infection. You should make sure you get enough calcium and vitamin D while you are taking this medicine, unless your doctor tells you not to. Discuss the foods you eat and the vitamins you take with your health care professional. See your dentist regularly. Brush and floss your teeth as directed. Before you have any dental work done, tell your dentist you are receiving this medicine. What side effects may I notice from receiving this medicine? Side effects that you should report to your doctor or health care professional as soon as possible: -allergic reactions like skin rash, itching or hives, swelling of the face, lips, or  tongue -breathing problems -chest pain -fast, irregular heartbeat -feeling faint or lightheaded, falls -fever, chills, or any other sign of infection -muscle spasms, tightening, or twitches -numbness or tingling -skin blisters or bumps,  or is dry, peels, or red -slow healing or unexplained pain in the mouth or jaw -unusual bleeding or bruising Side effects that usually do not require medical attention (Report these to your doctor or health care professional if they continue or are bothersome.): -muscle pain -stomach upset, gas This list may not describe all possible side effects. Call your doctor for medical advice about side effects. You may report side effects to FDA at 1-800-FDA-1088. Where should I keep my medicine? This medicine is only given in a clinic, doctor's office, or other health care setting and will not be stored at home. NOTE: This sheet is a summary. It may not cover all possible information. If you have questions about this medicine, talk to your doctor, pharmacist, or health care provider.  2013, [employer]vier/[employer]. (10/23/2010 3:40:41 PM)

## 2012-06-08 NOTE — Progress Notes (Signed)
OFFICE PROGRESS NOTE  CC  Evans,Tamara A, MD [address] [place] [address] Dr. Antony Blackbird Dr. Emelia Loron  DIAGNOSIS: 67 year old female with invasive mammary carcinoma of the right breast diagnosed 09/18/2010.  PRIOR THERAPY: #1 right breast lumpectomy that revealed a 0.8 cm invasive ductal carcinoma grade 2. 2 sentinel nodes were negative for metastatic disease. The tumor was ER +100% PR +40% proliferation marker 64% with HER-2/neu not amplified with a ratio of 1.07.  #2 patient is status post radiation therapy administered by Dr. Antony Blackbird.  #3 patient had an Oncotype DX performed that showed a recurrence score of 23 putting her in the intermediate risk category with a 5 year risk of distant recurrence at about sent with tamoxifen therapy.  #4 patient was begun on letrozole 2.5 mg daily. She is tolerating this well. Total of 5 years of therapy is planned.  CURRENT THERAPY: letrozole 2.5 mg daily.  INTERVAL HISTORY: Tamara Evans 67 y.o. female returns for follow up.  She is feeling well.  She has developed joint pains over the past couple of months, but is otherwise tolerating her Letrozole well.  She does go to the gym a couple of times per week.  She had a bone density scan performed that shows low bone mass. She has occasional hot flashes. No nausea or vomiting no chills night sweats. Remainder of the review of systems as noted below.  MEDICAL HISTORY: Past Medical History  Diagnosis Date  . Hyperlipidemia   . PMS (premenstrual syndrome)   . Fibrocystic breast disease   . Menopause   . Allergy     perennial  . Osteopenia 6/07    L-spine  . Multinodular goiter   . Goiter, nodular 05/06    managed by Dr. Juleen China  . Breast cancer     right breast stage I, er/pr positive, invasive ductal CA. Treated with lumpectomy and radiation  . Hx of radiation therapy     right breast    ALLERGIES:  is allergic to codeine.  MEDICATIONS:  Current  Outpatient Prescriptions  Medication Sig Dispense Refill  . alendronate (FOSAMAX) 70 MG tablet TAKE ONE TABLET BY MOUTH WEEKLY  12 tablet  0  . aspirin 81 MG tablet Take 81 mg by mouth daily.        Marland Kitchen atorvastatin (LIPITOR) 20 MG tablet TAKE ONE TABLET BY MOUTH ONE TIME DAILY  90 tablet  3  . calcium carbonate (OS-CAL) 600 MG TABS Take 600 mg by mouth 2 (two) times daily with a meal.        . fish oil-omega-3 fatty acids 1000 MG capsule Take 1 g by mouth daily.        Marland Kitchen letrozole (FEMARA) 2.5 MG tablet Take 1 tablet (2.5 mg total) by mouth daily.  90 tablet  0  . levothyroxine (SYNTHROID, LEVOTHROID) 100 MCG tablet Take 112 mcg by mouth daily.       . Multiple Vitamin (MULTIVITAMIN) tablet Take 1 tablet by mouth daily.         No current facility-administered medications for this visit.    SURGICAL HISTORY:  Past Surgical History  Procedure Laterality Date  . Cervical polypectomy      in the patient's 20's  . Breast lumpectomy  10/05/10    lumpectomy and sentinel node biopsy (Right)    REVIEW OF SYSTEMS:   General: fatigue (-), night sweats (-), fever (-), pain (-) Lymph: palpable nodes (-) HEENT: vision changes (-), mucositis (-), gum  bleeding (-), epistaxis (-) Cardiovascular: chest pain (-), palpitations (-) Pulmonary: shortness of breath (-), dyspnea on exertion (-), cough (-), hemoptysis (-) GI:  Early satiety (-), melena (-), dysphagia (-), nausea/vomiting (-), diarrhea (-) GU: dysuria (-), hematuria (-), incontinence (-) Musculoskeletal: joint swelling (-), joint pain (-), back pain (-) Neuro: weakness (-), numbness (-), headache (-), confusion (-) Skin: Rash (-), lesions (-), dryness (-) Psych: depression (-), suicidal/homicidal ideation (-), feeling of hopelessness (-)   Health Maintenance  Mammogram: 09/2011 Colonoscopy: 2003 Bone Density Scan: 12/2009 Pap Smear: 01/2011 Eye Exam: 10/2011 Vitamin D Level: n/a Lipid Panel: 01/2011   PHYSICAL EXAMINATION: BP  134/83  Pulse 76  Temp(Src) 98.4 F (36.9 C) (Oral)  Resp 20  Ht 5\' 3"  (1.6 m)  Wt 157 lb 8 oz (71.442 kg)  BMI 27.91 kg/m2 General: Patient is a well appearing female in no acute distress HEENT: PERRLA, sclerae anicteric no conjunctival pallor, MMM Neck: supple, no palpable adenopathy Lungs: clear to auscultation bilaterally, no wheezes, rhonchi, or rales Cardiovascular: regular rate rhythm, S1, S2, no murmurs, rubs or gallops Abdomen: Soft, non-tender, non-distended, normoactive bowel sounds, no HSM Extremities: warm and well perfused, no clubbing, cyanosis, or edema Skin: No rashes or lesions Neuro: Non-focal Breast examination: Right breast no masses nipple discharge or skin changes. Right breast reveals a well-healed surgical scar. There is some fullness in this region but no palpable masses. There are no skin changes no nipple discharge retraction or inversion. ECOG PERFORMANCE STATUS: 1 - Symptomatic but completely ambulatory  LABORATORY DATA: Lab Results  Component Value Date   WBC 5.1 03/25/2012   HGB 14.7 03/25/2012   HCT 43.2 03/25/2012   MCV 75.8* 03/25/2012   PLT 199 03/25/2012       RADIOGRAPHIC STUDIES:  No results found.  ASSESSMENT: 67 year old female with  #1 invasive ductal carcinoma that was grade 2 status post right breast lumpectomy with sentinel node biopsy 10/05/10. The final pathology revealed a 0.8 cm invasive ductal carcinoma 2 sentinel nodes were negative for metastatic disease. Tumor was ER positive PR positive HER-2/neu negative with a proliferation marker 64%.  #2 patient is now status post radiation therapy and overall she tolerated it well.  #3 On Letrozole 2.5 mg daily since January 2013  #4 low bone mass by a recent bone density performed December 2013.   PLAN:  #1 She is on adjuvant endocrine therapy consisting of letrozole 2.5 mg daily. She is tolerating this well.  She will continue this, there is no sign of recurrence.  We did discuss  self breast exams and getting used to the new normal of her right breast.  We discussed continuing exercise and water aerobics.  Her health maintenance was reviewed.    #2 patient has been on Fosamax for osteoporosis she continues to have low bone mass. She and I discussed the possibility of switching her over to prolia every 6 months. She is planning on changing insurance companies and therefore we will do this in about 4-6 months time.  #3 patient will be seen back in 4 months time for followup.   All questions were answered. The patient knows to call the clinic with any problems, questions or concerns. We can certainly see the patient much sooner if necessary.  I spent 25 minutes counseling the patient face to face. The total time spent in the appointment was 30 minutes.   [name], MD Medical/Oncology [facility] [phone] (beeper) [phone] (Office)  06/08/2012, 11:34 AM

## 2012-06-29 ENCOUNTER — Telehealth: Payer: Self-pay | Admitting: Internal Medicine

## 2012-06-29 DIAGNOSIS — M8580 Other specified disorders of bone density and structure, unspecified site: Secondary | ICD-10-CM

## 2012-06-29 MED ORDER — ALENDRONATE SODIUM 70 MG PO TABS: ORAL_TABLET | ORAL | Status: DC

## 2012-06-29 NOTE — Telephone Encounter (Signed)
Pt needs a refill on alendronate 70mg  a 90 day supply to [facility]

## 2012-07-07 ENCOUNTER — Telehealth: Payer: Self-pay | Admitting: Oncology

## 2012-07-07 NOTE — Telephone Encounter (Signed)
, °

## 2012-08-20 DIAGNOSIS — E0789 Other specified disorders of thyroid: Secondary | ICD-10-CM | POA: Diagnosis not present

## 2012-09-02 ENCOUNTER — Other Ambulatory Visit: Payer: Self-pay

## 2012-10-02 ENCOUNTER — Other Ambulatory Visit: Payer: Self-pay | Admitting: Oncology

## 2012-10-02 DIAGNOSIS — Z853 Personal history of malignant neoplasm of breast: Secondary | ICD-10-CM

## 2012-10-07 ENCOUNTER — Other Ambulatory Visit: Payer: BC Managed Care – PPO | Admitting: Lab

## 2012-10-07 ENCOUNTER — Ambulatory Visit: Payer: BC Managed Care – PPO | Admitting: Oncology

## 2012-10-09 ENCOUNTER — Telehealth: Payer: Self-pay | Admitting: Oncology

## 2012-10-09 ENCOUNTER — Other Ambulatory Visit (HOSPITAL_BASED_OUTPATIENT_CLINIC_OR_DEPARTMENT_OTHER): Payer: BC Managed Care – PPO | Admitting: Lab

## 2012-10-09 ENCOUNTER — Ambulatory Visit (HOSPITAL_BASED_OUTPATIENT_CLINIC_OR_DEPARTMENT_OTHER): Payer: Medicare Other | Admitting: Oncology

## 2012-10-09 ENCOUNTER — Encounter: Payer: Self-pay | Admitting: Oncology

## 2012-10-09 VITALS — BP 132/80 | HR 80 | Temp 98.4°F | Resp 18 | Ht 63.0 in | Wt 155.5 lb

## 2012-10-09 DIAGNOSIS — C50911 Malignant neoplasm of unspecified site of right female breast: Secondary | ICD-10-CM

## 2012-10-09 DIAGNOSIS — C50919 Malignant neoplasm of unspecified site of unspecified female breast: Secondary | ICD-10-CM

## 2012-10-09 LAB — COMPREHENSIVE METABOLIC PANEL (CC13)
AST: 21 U/L (ref 5–34)
Albumin: 3.5 g/dL (ref 3.5–5.0)
BUN: 27.2 mg/dL — ABNORMAL HIGH (ref 7.0–26.0)
CO2: 30 mEq/L — ABNORMAL HIGH (ref 22–29)
Calcium: 9.5 mg/dL (ref 8.4–10.4)
Chloride: 107 mEq/L (ref 98–109)
Glucose: 88 mg/dl (ref 70–140)
Potassium: 3.5 mEq/L (ref 3.5–5.1)

## 2012-10-09 LAB — CBC WITH DIFFERENTIAL/PLATELET
BASO%: 0.4 % (ref 0.0–2.0)
Basophils Absolute: 0 10*3/uL (ref 0.0–0.1)
Eosinophils Absolute: 0.1 10*3/uL (ref 0.0–0.5)
HCT: 44.2 % (ref 34.8–46.6)
HGB: 14.7 g/dL (ref 11.6–15.9)
MONO#: 0.4 10*3/uL (ref 0.1–0.9)
NEUT#: 3.3 10*3/uL (ref 1.5–6.5)
NEUT%: 66.8 % (ref 38.4–76.8)
Platelets: 188 10*3/uL (ref 145–400)
WBC: 5 10*3/uL (ref 3.9–10.3)
lymph#: 1.1 10*3/uL (ref 0.9–3.3)

## 2012-10-09 NOTE — Patient Instructions (Addendum)
Doing well  Continue letrozole  Daily  We will begin prolia on 9/19

## 2012-10-16 ENCOUNTER — Ambulatory Visit: Payer: BC Managed Care – PPO

## 2012-10-18 NOTE — Progress Notes (Signed)
OFFICE PROGRESS NOTE  CC  KNAPP,[name], MD [address] [place] [address] Dr. Antony Blackbird Dr. Emelia Loron  DIAGNOSIS: 67 year old female with invasive mammary carcinoma of the right breast diagnosed 09/18/2010.  PRIOR THERAPY: #1 right breast lumpectomy that revealed a 0.8 cm invasive ductal carcinoma grade 2. 2 sentinel nodes were negative for metastatic disease. The tumor was ER +100% PR +40% proliferation marker 64% with HER-2/neu not amplified with a ratio of 1.07.  #2 patient is status post radiation therapy administered by Dr. Antony Blackbird.  #3 patient had an Oncotype DX performed that showed a recurrence score of 23 putting her in the intermediate risk category with a 5 year risk of distant recurrence at about sent with tamoxifen therapy.  #4 patient was begun on letrozole 2.5 mg daily. She is tolerating this well. Total of 5 years of therapy is planned.  #5 begin prolia q 6 months starting 09/2012. We discusssed the side effects, rational and benefits. Literature was given to the patient  CURRENT THERAPY: letrozole 2.5 mg daily.  INTERVAL HISTORY: Tamara Evans 67 y.o. female returns for follow up.  She is feeling well.  She has developed joint pains over the past couple of months, but is otherwise tolerating her Letrozole well.  She does go to the gym a couple of times per week.  She had a bone density scan performed that shows low bone mass. She has occasional hot flashes. No nausea or vomiting no chills night sweats. Remainder of the review of systems as noted below.  MEDICAL HISTORY: Past Medical History  Diagnosis Date  . Hyperlipidemia   . PMS (premenstrual syndrome)   . Fibrocystic breast disease   . Menopause   . Allergy     perennial  . Osteopenia 6/07    L-spine  . Multinodular goiter   . Goiter, nodular 05/06    managed by Dr. Juleen China  . Breast cancer     right breast stage I, er/pr positive, invasive ductal CA. Treated with lumpectomy  and radiation  . Hx of radiation therapy     right breast    ALLERGIES:  is allergic to codeine.  MEDICATIONS:  Current Outpatient Prescriptions  Medication Sig Dispense Refill  . alendronate (FOSAMAX) 70 MG tablet TAKE ONE TABLET BY MOUTH WEEKLY  12 tablet  3  . aspirin 81 MG tablet Take 81 mg by mouth daily.        Marland Kitchen atorvastatin (LIPITOR) 20 MG tablet TAKE ONE TABLET BY MOUTH ONE TIME DAILY  90 tablet  3  . calcium carbonate (OS-CAL) 600 MG TABS Take 600 mg by mouth 2 (two) times daily with a meal.        . fish oil-omega-3 fatty acids 1000 MG capsule Take 1 g by mouth daily.        Marland Kitchen letrozole (FEMARA) 2.5 MG tablet Take 1 tablet (2.5 mg total) by mouth daily.  90 tablet  12  . levothyroxine (SYNTHROID, LEVOTHROID) 100 MCG tablet Take 112 mcg by mouth daily.       . Multiple Vitamin (MULTIVITAMIN) tablet Take 1 tablet by mouth daily.         No current facility-administered medications for this visit.    SURGICAL HISTORY:  Past Surgical History  Procedure Laterality Date  . Cervical polypectomy      in the patient's 20's  . Breast lumpectomy  10/05/10    lumpectomy and sentinel node biopsy (Right)    REVIEW OF SYSTEMS:  General: fatigue (-), night sweats (-), fever (-), pain (-) Lymph: palpable nodes (-) HEENT: vision changes (-), mucositis (-), gum bleeding (-), epistaxis (-) Cardiovascular: chest pain (-), palpitations (-) Pulmonary: shortness of breath (-), dyspnea on exertion (-), cough (-), hemoptysis (-) GI:  Early satiety (-), melena (-), dysphagia (-), nausea/vomiting (-), diarrhea (-) GU: dysuria (-), hematuria (-), incontinence (-) Musculoskeletal: joint swelling (-), joint pain (-), back pain (-) Neuro: weakness (-), numbness (-), headache (-), confusion (-) Skin: Rash (-), lesions (-), dryness (-) Psych: depression (-), suicidal/homicidal ideation (-), feeling of hopelessness (-)   Health Maintenance  Mammogram: 09/2011 Colonoscopy: 2003 Bone Density  Scan: 12/2009 Pap Smear: 01/2011 Eye Exam: 10/2011 Vitamin D Level: n/a Lipid Panel: 01/2011   PHYSICAL EXAMINATION: BP 132/80  Pulse 80  Temp(Src) 98.4 F (36.9 C) (Oral)  Resp 18  Ht 5\' 3"  (1.6 m)  Wt 155 lb 8 oz (70.534 kg)  BMI 27.55 kg/m2 General: Patient is a well appearing female in no acute distress HEENT: PERRLA, sclerae anicteric no conjunctival pallor, MMM Neck: supple, no palpable adenopathy Lungs: clear to auscultation bilaterally, no wheezes, rhonchi, or rales Cardiovascular: regular rate rhythm, S1, S2, no murmurs, rubs or gallops Abdomen: Soft, non-tender, non-distended, normoactive bowel sounds, no HSM Extremities: warm and well perfused, no clubbing, cyanosis, or edema Skin: No rashes or lesions Neuro: Non-focal Breast examination: Right breast no masses nipple discharge or skin changes. Right breast reveals a well-healed surgical scar. There is some fullness in this region but no palpable masses. There are no skin changes no nipple discharge retraction or inversion. ECOG PERFORMANCE STATUS: 1 - Symptomatic but completely ambulatory  LABORATORY DATA: Lab Results  Component Value Date   WBC 5.0 10/09/2012   HGB 14.7 10/09/2012   HCT 44.2 10/09/2012   MCV 78.2* 10/09/2012   PLT 188 10/09/2012       RADIOGRAPHIC STUDIES:  No results found.  ASSESSMENT: 67 year old female with  #1 invasive ductal carcinoma that was grade 2 status post right breast lumpectomy with sentinel node biopsy 10/05/10. The final pathology revealed a 0.8 cm invasive ductal carcinoma 2 sentinel nodes were negative for metastatic disease. Tumor was ER positive PR positive HER-2/neu negative with a proliferation marker 64%.  #2 patient is now status post radiation therapy and overall she tolerated it well.  #3 On Letrozole 2.5 mg daily since January 2013  #4 low bone mass by a recent bone density performed December 2013.   PLAN:  #1 She is on adjuvant endocrine therapy consisting of  letrozole 2.5 mg daily. She is tolerating this well.  She will continue this, there is no sign of recurrence.  We did discuss self breast exams and getting used to the new normal of her right breast.  We discussed continuing exercise and water aerobics.  Her health maintenance was reviewed.    #2 patient has been on Fosamax for osteoporosis she continues to have low bone mass. She and I discussed the possibility of switching her over to prolia every 6 months. She is planning on [employer] and tdo this in a week or so  #3 patient will be seen back in 6 months time for followup.   All questions were answered. The patient knows to call the clinic with any problems, questions or concerns. We can certainly see the patient much sooner if necessary.  I spent 25 minutes counseling the patient face to face. The total time spent in the appointment was 30 minutes.  Marcy Panning, MD Medical/Oncology [facility] [phone] (beeper) [phone] (Office)

## 2012-12-03 ENCOUNTER — Other Ambulatory Visit: Payer: Self-pay

## 2012-12-30 ENCOUNTER — Ambulatory Visit
Admission: RE | Admit: 2012-12-30 | Discharge: 2012-12-30 | Disposition: A | Discharge status: Home / Self Care | Payer: Medicare Other | Source: Ambulatory Visit | Attending: Oncology | Admitting: Oncology

## 2012-12-30 DIAGNOSIS — Z853 Personal history of malignant neoplasm of breast: Secondary | ICD-10-CM | POA: Diagnosis not present

## 2012-12-30 DIAGNOSIS — R928 Other abnormal and inconclusive findings on diagnostic imaging of breast: Secondary | ICD-10-CM | POA: Diagnosis not present

## 2013-02-08 DIAGNOSIS — E0789 Other specified disorders of thyroid: Secondary | ICD-10-CM | POA: Diagnosis not present

## 2013-02-12 DIAGNOSIS — E0789 Other specified disorders of thyroid: Secondary | ICD-10-CM | POA: Diagnosis not present

## 2013-02-18 ENCOUNTER — Other Ambulatory Visit: Payer: Self-pay | Admitting: Endocrinology

## 2013-02-18 DIAGNOSIS — E041 Nontoxic single thyroid nodule: Secondary | ICD-10-CM

## 2013-02-24 IMAGING — US US SOFT TISSUE HEAD/NECK
1 series · 14 of 25 positions shown · non-contrast
Comparison: Ultrasound of the thyroid of 08/20/2010

CLINICAL DATA: Follow up of thyroid nodules

THYROID ULTRASOUND
TECHNIQUE: Ultrasound examination of the thyroid gland and adjacent
soft tissues was performed.

[Series 1: us soft tissue head/neck · 0.08mm/px · 14 of 53 slices shown]
[im 1/53]
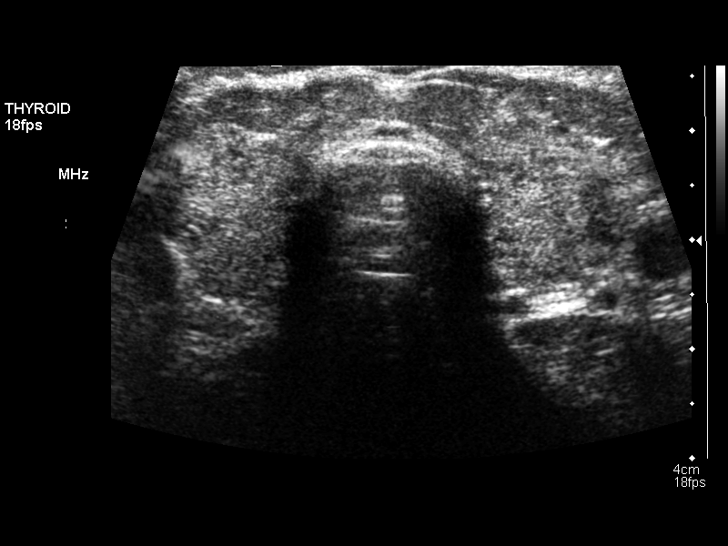
[im 5/53]
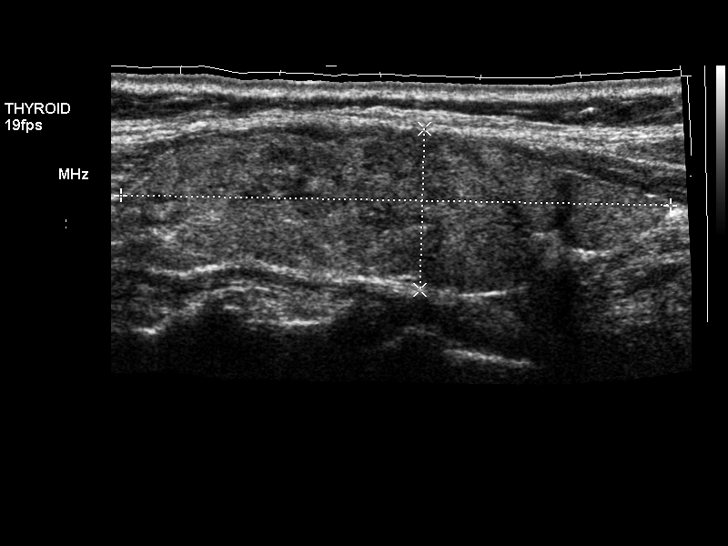
[im 9/53]
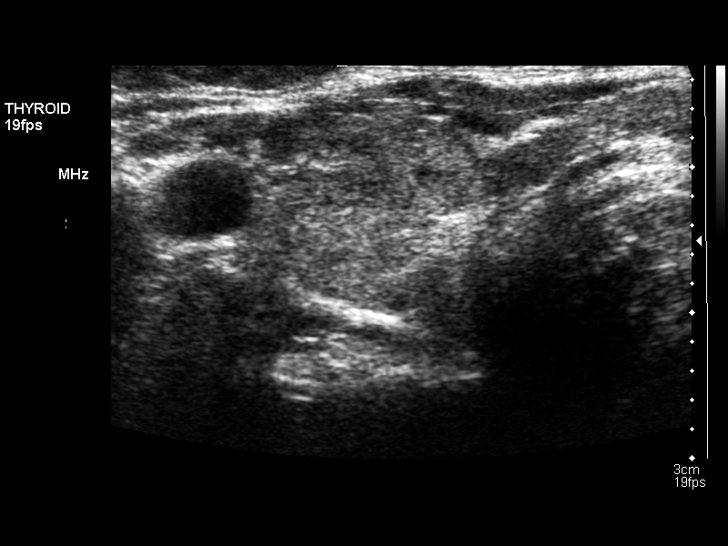
[im 14/53]
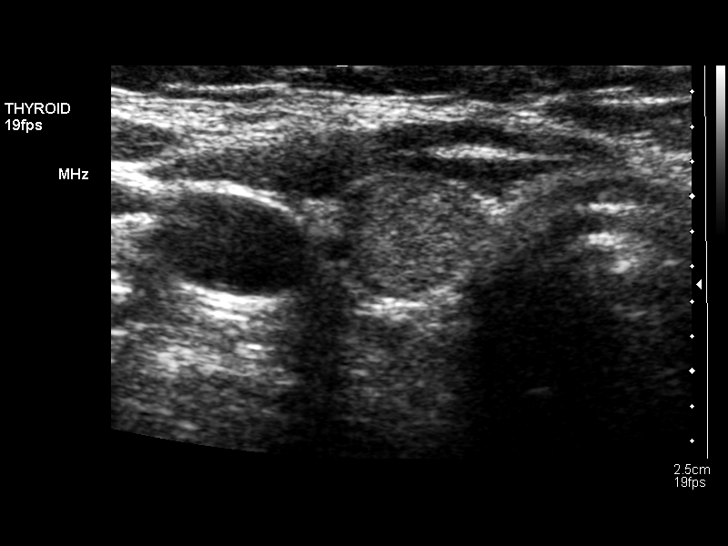
[im 18/53]
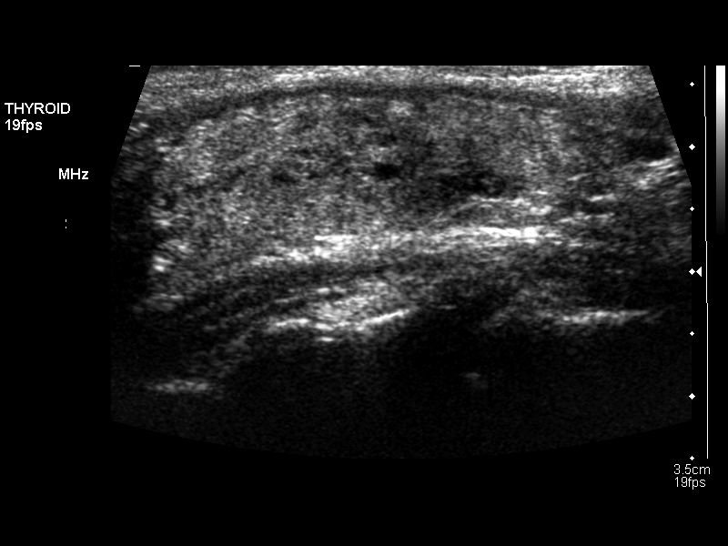
[im 20/53]
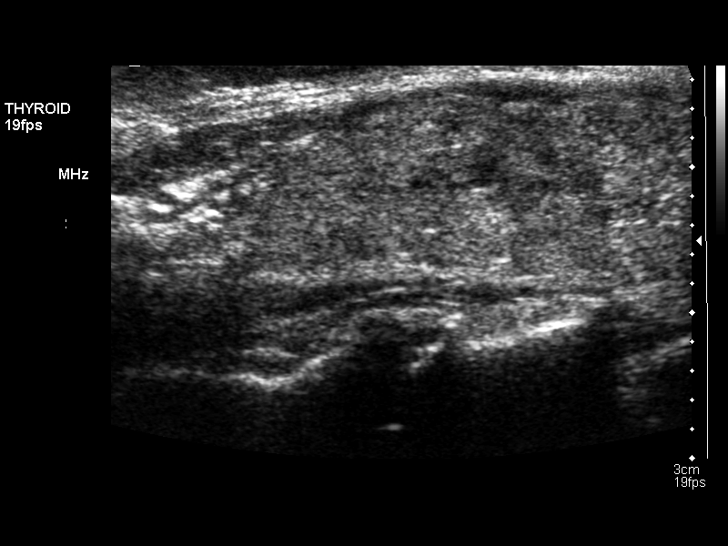
[im 24/53]
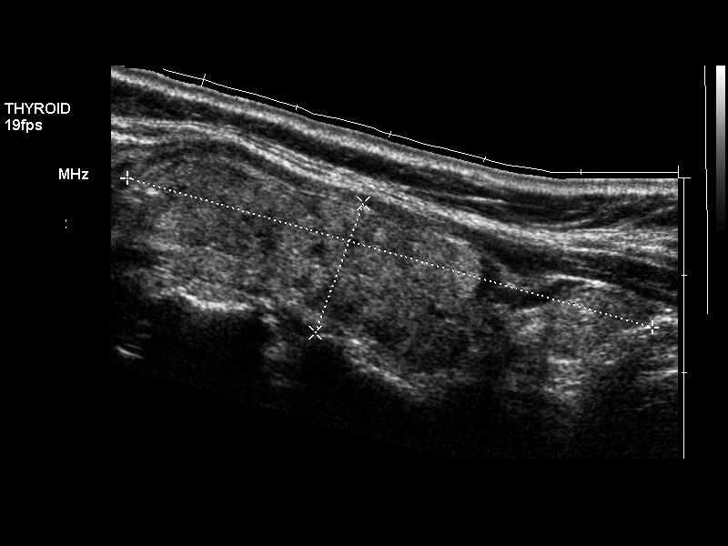
[im 29/53]
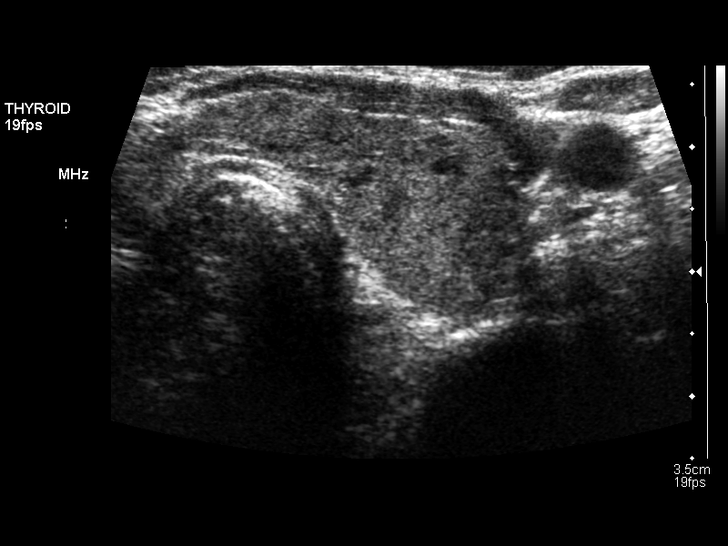
[im 33/53]
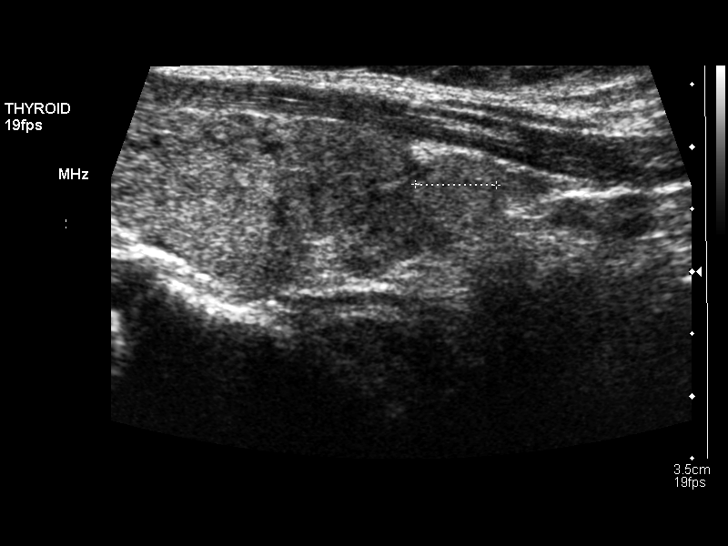
[im 35/53]
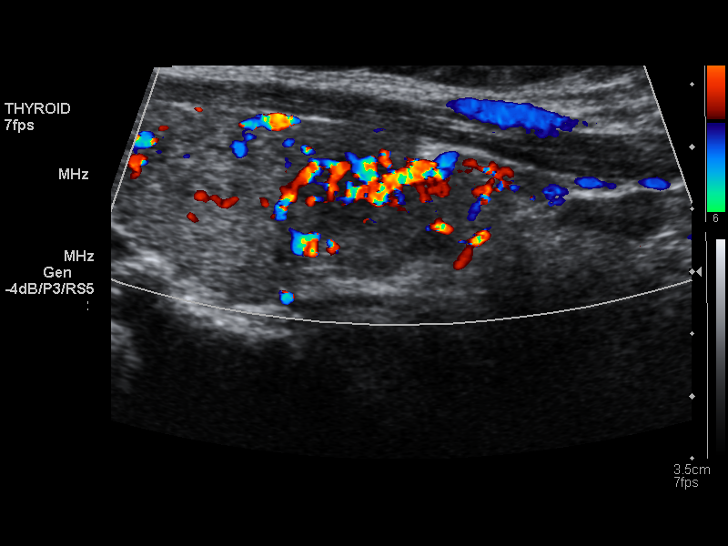
[im 40/53]
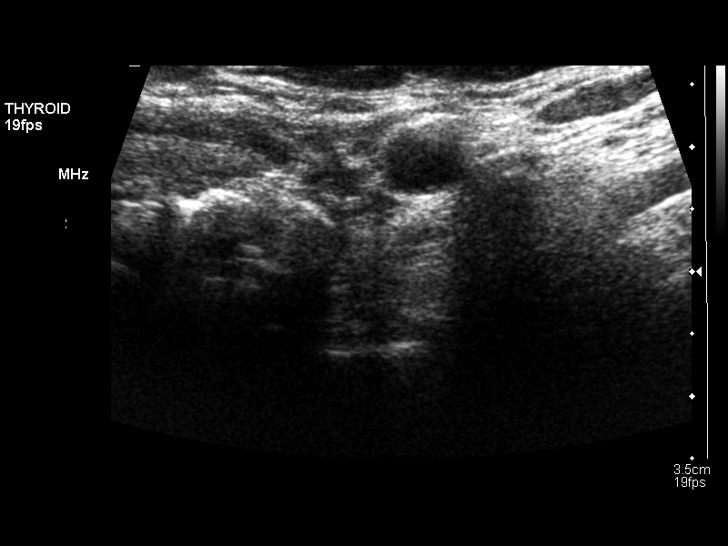
[im 44/53]
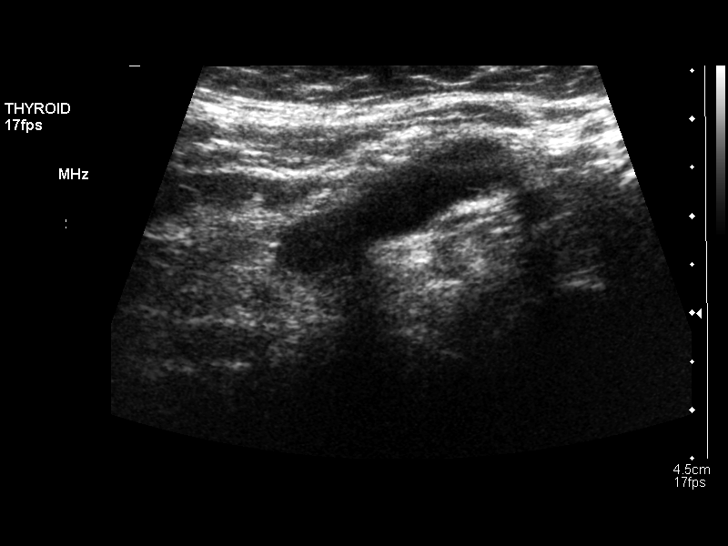
[im 48/53]
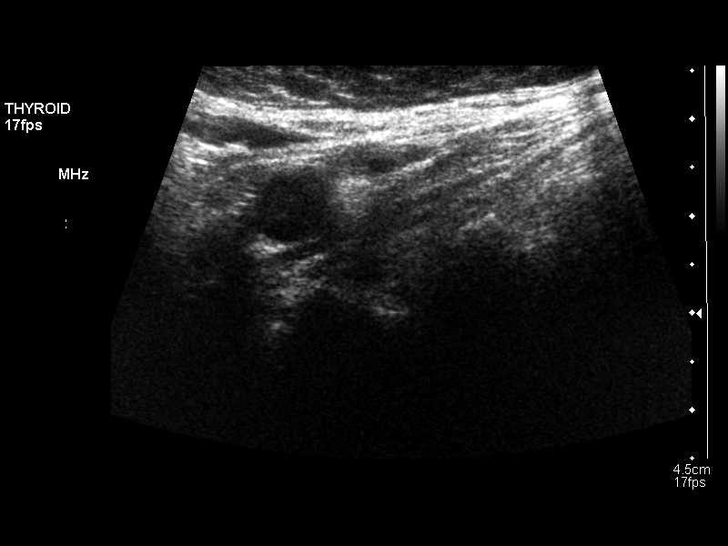
[im 53/53]
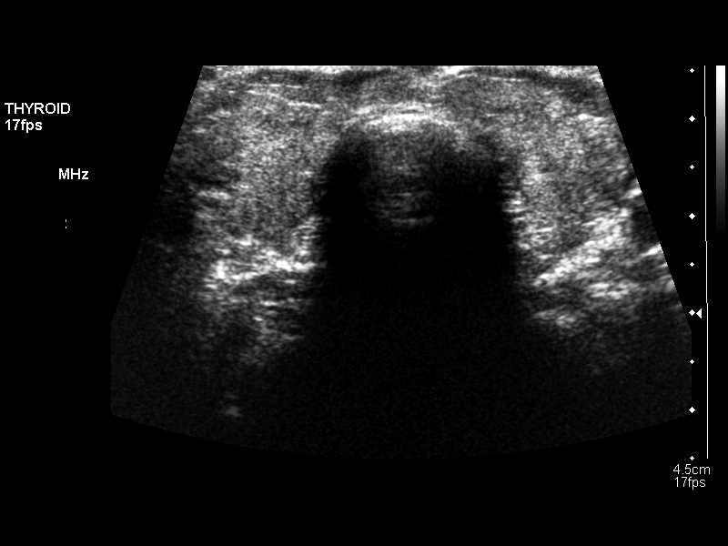

[14 of 25 positions shown; findings below may reference images not displayed]

FINDINGS: Right thyroid lobe:  5.5 x 1.6 x 1.5 cm.  (Previously 4.7 x 1.6 x
1.5 cm).
Left thyroid lobe:  5.6 x 1.4 x 1.6 cm.  (Previously 4.9 x 1.8 x
1.7 cm).
Isthmus:  3.8 mm in thickness.

Focal nodules:  The echogenicity of the thyroid parenchyma is
diffusely inhomogeneous.  Solid nodules again are noted.  The
largest nodule emanates from the lower pole of the right lobe of
thyroid measuring 1.1 x 0.7 x 0.8 cm compared to prior measurements
of 1.1 x 0.8 x 0.8 cm.  A nodule in the mid right lobe measures 6
mm in diameter.  A nodule in the lower pole on the left is smaller
measuring 0.9 x 0.8 x 0.7 cm compared to prior measurement of 1.6 x
1.3 x 1.0 cm.  No other measurable nodules are detected.

Lymphadenopathy:  None visualized.
IMPRESSION: Stable to slightly diminished size of thyroid nodules previously
described.  No new or enlarging nodule is seen.

## 2013-03-30 DIAGNOSIS — H40019 Open angle with borderline findings, low risk, unspecified eye: Secondary | ICD-10-CM | POA: Diagnosis not present

## 2013-04-22 ENCOUNTER — Telehealth: Payer: Self-pay

## 2013-04-22 NOTE — Telephone Encounter (Signed)
Pt LM wanting to know if we can see labs she had done last month - she does not want to repeat labs for her appt tomorrow if avoidable.  LMOVM that we do not have access to her labs and she will need to keep her lab appt on 3/26.

## 2013-04-23 ENCOUNTER — Encounter: Payer: Self-pay | Admitting: Oncology

## 2013-04-23 ENCOUNTER — Ambulatory Visit (HOSPITAL_BASED_OUTPATIENT_CLINIC_OR_DEPARTMENT_OTHER): Payer: BC Managed Care – PPO | Admitting: Oncology

## 2013-04-23 ENCOUNTER — Other Ambulatory Visit: Payer: Medicare Other

## 2013-04-23 ENCOUNTER — Ambulatory Visit: Payer: BC Managed Care – PPO

## 2013-04-23 VITALS — BP 134/84 | HR 81 | Temp 98.3°F | Resp 18 | Ht 63.0 in | Wt 154.7 lb

## 2013-04-23 DIAGNOSIS — M009 Pyogenic arthritis, unspecified: Secondary | ICD-10-CM

## 2013-04-23 DIAGNOSIS — C50919 Malignant neoplasm of unspecified site of unspecified female breast: Secondary | ICD-10-CM | POA: Diagnosis not present

## 2013-04-23 DIAGNOSIS — Z170 Estrogen receptor positive status [ER+]: Secondary | ICD-10-CM | POA: Diagnosis not present

## 2013-04-23 NOTE — Progress Notes (Signed)
[place] OFFICE PROGRESS NOTE  Patient Care Team: Rita Ohara, MD as PCP - General (Family Medicine) Dr. Gery Pray  Dr. Rolm Bookbinder  DIAGNOSIS: 68 year old female with invasive mammary carcinoma of the right breast diagnosed 09/18/2010   SUMMARY OF ONCOLOGIC HISTORY: #1 right breast lumpectomy that revealed a 0.8 cm invasive ductal carcinoma grade 2. 2 sentinel nodes were negative for metastatic disease. The tumor was ER +100% PR +40% proliferation marker 64% with HER-2/neu not amplified with a ratio of 1.07.  #2 patient is status post radiation therapy administered by Dr. Gery Pray.  #3 patient had an Oncotype DX performed that showed a recurrence score of 23 putting her in the intermediate risk category with a 5 year risk of distant recurrence at about sent with tamoxifen therapy.  #4 patient was begun on letrozole 2.5 mg daily. She is tolerating this well. Total of 5 years of therapy is planned.  #5 begin prolia q 6 months starting 09/2012. We discusssed the side effects, rational and benefits. Literature was given to the patient   CURRENT THERAPY: letrozole 2.5 mg daily./prolia q 6 months (04/30/13)  INTERVAL HISTORY: Tamara Evans 68 y.o. female returns for followup today. She has been on letrozole 2.5 mg on a daily basis. She is tolerating it well although she does have occasional aches and pains but they are very tolerable. She has some hot flashes. No nausea or vomiting no vaginal bleeding or discharge. Remainder of the 10 point review of systems is negative and as below  I have reviewed the past medical history, past surgical history, social history and family history with the patient and they are unchanged from previous note.  ALLERGIES:  is allergic to codeine.  MEDICATIONS:  Current Outpatient Prescriptions  Medication Sig Dispense Refill  . alendronate (FOSAMAX) 70 MG tablet TAKE ONE TABLET BY MOUTH WEEKLY  12 tablet  3  . aspirin 81 MG tablet  Take 81 mg by mouth daily.        Marland Kitchen atorvastatin (LIPITOR) 20 MG tablet TAKE ONE TABLET BY MOUTH ONE TIME DAILY  90 tablet  3  . calcium carbonate (OS-CAL) 600 MG TABS Take 600 mg by mouth 2 (two) times daily with a meal.        . fish oil-omega-3 fatty acids 1000 MG capsule Take 1 g by mouth daily.        Marland Kitchen letrozole (FEMARA) 2.5 MG tablet Take 1 tablet (2.5 mg total) by mouth daily.  90 tablet  12  . levothyroxine (SYNTHROID, LEVOTHROID) 100 MCG tablet Take 112 mcg by mouth daily.       . Multiple Vitamin (MULTIVITAMIN) tablet Take 1 tablet by mouth daily.         No current facility-administered medications for this visit.    REVIEW OF SYSTEMS:   Constitutional: Denies fevers, chills or abnormal weight loss Eyes: Denies blurriness of vision Ears, nose, mouth, throat, and face: Denies mucositis or sore throat Respiratory: Denies cough, dyspnea or wheezes Cardiovascular: Denies palpitation, chest discomfort or lower extremity swelling Gastrointestinal:  Denies nausea, heartburn or change in bowel habits Skin: Denies abnormal skin rashes Lymphatics: Denies new lymphadenopathy or easy bruising Neurological:Denies numbness, tingling or new weaknesses Behavioral/Psych: Mood is stable, no new changes  All other systems were reviewed with the patient and are negative.  PHYSICAL EXAMINATION: ECOG PERFORMANCE STATUS: 0 - Asymptomatic  Filed Vitals:   04/23/13 1113  BP: 134/84  Pulse: 81  Temp: 98.3 F (36.8  C)  Resp: 18   Filed Weights   04/23/13 1113  Weight: 154 lb 11.2 oz (70.171 kg)    GENERAL:alert, no distress and comfortable SKIN: skin color, texture, turgor are normal, no rashes or significant lesions EYES: normal, Conjunctiva are pink and non-injected, sclera clear OROPHARYNX:no exudate, no erythema and lips, buccal mucosa, and tongue normal  NECK: supple, thyroid normal size, non-tender, without nodularity LYMPH:  no palpable lymphadenopathy in the cervical, axillary  or inguinal LUNGS: clear to auscultation and percussion with normal breathing effort HEART: regular rate & rhythm and no murmurs and no lower extremity edema ABDOMEN:abdomen soft, non-tender and normal bowel sounds Musculoskeletal:no cyanosis of digits and no clubbing  NEURO: alert & oriented x 3 with fluent speech, no focal motor/sensory deficits Bilateral breast examination: Well healed surgical scar no masses or nipple discharge in either of the breasts. Right breast with well-healed surgical scar.  LABORATORY DATA:  I have reviewed the data as listed    Component Value Date/Time   NA 146* 10/09/2012 0957   NA 140 03/25/2012 0903   K 3.5 10/09/2012 0957   K 3.9 03/25/2012 0903   CL 103 03/25/2012 0903   CL 105 12/10/2011 0853   CO2 30* 10/09/2012 0957   CO2 29 03/25/2012 0903   GLUCOSE 88 10/09/2012 0957   GLUCOSE 91 03/25/2012 0903   GLUCOSE 108* 12/10/2011 0853   BUN 27.2* 10/09/2012 0957   BUN 29* 03/25/2012 0903   CREATININE 0.8 10/09/2012 0957   CREATININE 0.80 03/25/2012 0903   CREATININE 0.75 05/20/2011 0929   CALCIUM 9.5 10/09/2012 0957   CALCIUM 9.5 03/25/2012 0903   PROT 7.1 10/09/2012 0957   PROT 6.9 03/25/2012 0903   ALBUMIN 3.5 10/09/2012 0957   ALBUMIN 4.2 03/25/2012 0903   AST 21 10/09/2012 0957   AST 24 03/25/2012 0903   ALT 31 10/09/2012 0957   ALT 31 03/25/2012 0903   ALKPHOS 77 10/09/2012 0957   ALKPHOS 67 03/25/2012 0903   BILITOT 0.64 10/09/2012 0957   BILITOT 0.5 03/25/2012 0903   GFRNONAA >60 10/03/2010 0832   GFRAA >60 10/03/2010 0832    No results found for this basename: SPEP, UPEP,  kappa and lambda light chains    Lab Results  Component Value Date   WBC 5.0 10/09/2012   NEUTROABS 3.3 10/09/2012   HGB 14.7 10/09/2012   HCT 44.2 10/09/2012   MCV 78.2* 10/09/2012   PLT 188 10/09/2012      Chemistry      Component Value Date/Time   NA 146* 10/09/2012 0957   NA 140 03/25/2012 0903   K 3.5 10/09/2012 0957   K 3.9 03/25/2012 0903   CL 103 03/25/2012 0903   CL 105  12/10/2011 0853   CO2 30* 10/09/2012 0957   CO2 29 03/25/2012 0903   BUN 27.2* 10/09/2012 0957   BUN 29* 03/25/2012 0903   CREATININE 0.8 10/09/2012 0957   CREATININE 0.80 03/25/2012 0903   CREATININE 0.75 05/20/2011 0929      Component Value Date/Time   CALCIUM 9.5 10/09/2012 0957   CALCIUM 9.5 03/25/2012 0903   ALKPHOS 77 10/09/2012 0957   ALKPHOS 67 03/25/2012 0903   AST 21 10/09/2012 0957   AST 24 03/25/2012 0903   ALT 31 10/09/2012 0957   ALT 31 03/25/2012 0903   BILITOT 0.64 10/09/2012 0957   BILITOT 0.5 03/25/2012 6063       RADIOGRAPHIC STUDIES: I have personally reviewed the radiological images as listed and agreed  with the findings in the report. No results found.    ASSESSMENT & PLAN:  68 year old female with  #1 history of invasive ductal carcinoma of the right breast diagnosed a 21 2012. She underwent a right lumpectomy which revealed a 0.8 cm disease grade 22 sentinel nodes were negative for metastatic disease. Tumor was ER +100% PR +40% proliferation marker Ki-67 64% HER-2/neu negative. Patient did have Oncotype DX testing performed recurrence score was 23 putting her in the intermediate risk category. She declined chemotherapy. She was begun on letrozole 2.5 mg daily tolerating it well so far total of 5 years of therapy is planned. She has no evidence of recurrent disease.  #2 low bone mass: Patient was begun on prolia every 6 months. Her last dose was in September 2014. She will receive her second dose on 04/30/2013. Risks benefits and side effects of this have been discussed with her previously. We discussed the complications as well. She understands and likes to proceed with this medication.  #3. Followup: Patient will be seen back in 8 months time. She will continue the letrozole. She will call us with any problems questions or concerns.  No orders of the defined types were placed in this encounter.   All questions were answered. The patient knows to call the clinic with  any problems, questions or concerns. No barriers to learning was detected. I spent 20 minutes counseling the patient face to face. The total time spent in the appointment was 30 minutes and more than 50% was on counseling and review of test results and coordination of care     Marcy Panning, MD 04/23/2013 11:22 AM

## 2013-04-26 ENCOUNTER — Telehealth: Payer: Self-pay | Admitting: Oncology

## 2013-04-26 DIAGNOSIS — H40029 Open angle with borderline findings, high risk, unspecified eye: Secondary | ICD-10-CM | POA: Diagnosis not present

## 2013-04-26 NOTE — Telephone Encounter (Signed)
, °

## 2013-04-26 NOTE — Progress Notes (Signed)
Rcvd [facility] dtd 04/26/13 lab results.  Provided t o KK

## 2013-04-30 ENCOUNTER — Other Ambulatory Visit: Payer: Medicare Other

## 2013-04-30 ENCOUNTER — Ambulatory Visit (HOSPITAL_BASED_OUTPATIENT_CLINIC_OR_DEPARTMENT_OTHER): Payer: Medicare Other

## 2013-04-30 VITALS — BP 134/64 | HR 74 | Temp 98.6°F

## 2013-04-30 DIAGNOSIS — M810 Age-related osteoporosis without current pathological fracture: Secondary | ICD-10-CM

## 2013-04-30 DIAGNOSIS — M949 Disorder of cartilage, unspecified: Principal | ICD-10-CM

## 2013-04-30 DIAGNOSIS — C50919 Malignant neoplasm of unspecified site of unspecified female breast: Secondary | ICD-10-CM

## 2013-04-30 DIAGNOSIS — M8580 Other specified disorders of bone density and structure, unspecified site: Secondary | ICD-10-CM

## 2013-04-30 DIAGNOSIS — M899 Disorder of bone, unspecified: Secondary | ICD-10-CM

## 2013-04-30 MED ORDER — DENOSUMAB 60 MG/ML ~~LOC~~ SOLN
60.0000 mg | Freq: Once | SUBCUTANEOUS | Status: AC
Start: 2013-04-30 — End: 2013-04-30
  Administered 2013-04-30: 60 mg via SUBCUTANEOUS
  Filled 2013-04-30: qty 1

## 2013-04-30 NOTE — Patient Instructions (Signed)
Denosumab injection  What is this medicine?  DENOSUMAB (den oh sue mab) slows bone breakdown. Prolia is used to treat osteoporosis in women after menopause and in men. Xgeva is used to prevent bone fractures and other bone problems caused by cancer bone metastases. Xgeva is also used to treat giant cell tumor of the bone.  This medicine may be used for other purposes; ask your health care provider or pharmacist if you have questions.  COMMON BRAND NAME(S): Prolia, XGEVA  What should I tell my health care provider before I take this medicine?  They need to know if you have any of these conditions:  -dental disease  -eczema  -infection or history of infections  -kidney disease or on dialysis  -low blood calcium or vitamin D  -malabsorption syndrome  -scheduled to have surgery or tooth extraction  -taking medicine that contains denosumab  -thyroid or parathyroid disease  -an unusual reaction to denosumab, other medicines, foods, dyes, or preservatives  -pregnant or trying to get pregnant  -breast-feeding  How should I use this medicine?  This medicine is for injection under the skin. It is given by a health care professional in a hospital or clinic setting.  If you are getting Prolia, a special MedGuide will be given to you by the pharmacist with each prescription and refill. Be sure to read this information carefully each time.  For Prolia, talk to your pediatrician regarding the use of this medicine in children. Special care may be needed. For Xgeva, talk to your pediatrician regarding the use of this medicine in children. While this drug may be prescribed for children as young as 13 years for selected conditions, precautions do apply.  Overdosage: If you think you've taken too much of this medicine contact a poison control center or emergency room at once.  Overdosage: If you think you have taken too much of this medicine contact a poison control center or emergency room at once.  NOTE: This medicine is only for  you. Do not share this medicine with others.  What if I miss a dose?  It is important not to miss your dose. Call your doctor or health care professional if you are unable to keep an appointment.  What may interact with this medicine?  Do not take this medicine with any of the following medications:  -other medicines containing denosumab  This medicine may also interact with the following medications:  -medicines that suppress the immune system  -medicines that treat cancer  -steroid medicines like prednisone or cortisone  This list may not describe all possible interactions. Give your health care provider a list of all the medicines, herbs, non-prescription drugs, or dietary supplements you use. Also tell them if you smoke, drink alcohol, or use illegal drugs. Some items may interact with your medicine.  What should I watch for while using this medicine?  Visit your doctor or health care professional for regular checks on your progress. Your doctor or health care professional may order blood tests and other tests to see how you are doing.  Call your doctor or health care professional if you get a cold or other infection while receiving this medicine. Do not treat yourself. This medicine may decrease your body's ability to fight infection.  You should make sure you get enough calcium and vitamin D while you are taking this medicine, unless your doctor tells you not to. Discuss the foods you eat and the vitamins you take with your health care professional.    See your dentist regularly. Brush and floss your teeth as directed. Before you have any dental work done, tell your dentist you are receiving this medicine.  Do not become pregnant while taking this medicine or for 5 months after stopping it. Women should inform their doctor if they wish to become pregnant or think they might be pregnant. There is a potential for serious side effects to an unborn child. Talk to your health care professional or pharmacist for more  information.  What side effects may I notice from receiving this medicine?  Side effects that you should report to your doctor or health care professional as soon as possible:  -allergic reactions like skin rash, itching or hives, swelling of the face, lips, or tongue  -breathing problems  -chest pain  -fast, irregular heartbeat  -feeling faint or lightheaded, falls  -fever, chills, or any other sign of infection  -muscle spasms, tightening, or twitches  -numbness or tingling  -skin blisters or bumps, or is dry, peels, or red  -slow healing or unexplained pain in the mouth or jaw  -unusual bleeding or bruising  Side effects that usually do not require medical attention (Report these to your doctor or health care professional if they continue or are bothersome.):  -muscle pain  -stomach upset, gas  This list may not describe all possible side effects. Call your doctor for medical advice about side effects. You may report side effects to FDA at 1-800-FDA-1088.  Where should I keep my medicine?  This medicine is only given in a clinic, doctor's office, or other health care setting and will not be stored at home.  NOTE: This sheet is a summary. It may not cover all possible information. If you have questions about this medicine, talk to your doctor, pharmacist, or health care provider.  © 2014, [employer]. (2011-07-15 12:37:47)

## 2013-06-05 ENCOUNTER — Other Ambulatory Visit: Payer: Self-pay | Admitting: Family Medicine

## 2013-06-19 ENCOUNTER — Other Ambulatory Visit: Payer: Self-pay | Admitting: Family Medicine

## 2013-06-30 ENCOUNTER — Encounter: Payer: Self-pay | Admitting: Family Medicine

## 2013-07-21 ENCOUNTER — Ambulatory Visit
Admission: RE | Admit: 2013-07-21 | Discharge: 2013-07-21 | Disposition: A | Discharge status: Home / Self Care | Payer: Medicare Other | Source: Ambulatory Visit | Attending: Endocrinology | Admitting: Endocrinology

## 2013-07-21 DIAGNOSIS — E042 Nontoxic multinodular goiter: Secondary | ICD-10-CM | POA: Diagnosis not present

## 2013-07-21 DIAGNOSIS — E041 Nontoxic single thyroid nodule: Secondary | ICD-10-CM

## 2013-07-22 DIAGNOSIS — E0789 Other specified disorders of thyroid: Secondary | ICD-10-CM | POA: Diagnosis not present

## 2013-08-04 ENCOUNTER — Other Ambulatory Visit: Payer: Medicare Other

## 2013-08-19 DIAGNOSIS — E0789 Other specified disorders of thyroid: Secondary | ICD-10-CM | POA: Diagnosis not present

## 2013-08-24 ENCOUNTER — Other Ambulatory Visit: Payer: Self-pay | Admitting: Endocrinology

## 2013-08-24 DIAGNOSIS — E049 Nontoxic goiter, unspecified: Secondary | ICD-10-CM

## 2013-08-25 ENCOUNTER — Other Ambulatory Visit: Payer: Self-pay | Admitting: Oncology

## 2013-08-25 DIAGNOSIS — C50919 Malignant neoplasm of unspecified site of unspecified female breast: Secondary | ICD-10-CM

## 2013-10-22 ENCOUNTER — Ambulatory Visit (INDEPENDENT_AMBULATORY_CARE_PROVIDER_SITE_OTHER): Payer: BC Managed Care – PPO | Admitting: Family Medicine

## 2013-10-22 ENCOUNTER — Encounter: Payer: Self-pay | Admitting: Family Medicine

## 2013-10-22 VITALS — BP 124/72 | HR 68 | Ht 63.5 in | Wt 152.0 lb

## 2013-10-22 DIAGNOSIS — E039 Hypothyroidism, unspecified: Secondary | ICD-10-CM | POA: Diagnosis not present

## 2013-10-22 DIAGNOSIS — M2240 Chondromalacia patellae, unspecified knee: Secondary | ICD-10-CM

## 2013-10-22 DIAGNOSIS — M899 Disorder of bone, unspecified: Secondary | ICD-10-CM | POA: Diagnosis not present

## 2013-10-22 DIAGNOSIS — M8580 Other specified disorders of bone density and structure, unspecified site: Secondary | ICD-10-CM

## 2013-10-22 DIAGNOSIS — M2241 Chondromalacia patellae, right knee: Secondary | ICD-10-CM

## 2013-10-22 DIAGNOSIS — E7800 Pure hypercholesterolemia, unspecified: Secondary | ICD-10-CM

## 2013-10-22 DIAGNOSIS — M949 Disorder of cartilage, unspecified: Secondary | ICD-10-CM

## 2013-10-22 DIAGNOSIS — Z23 Encounter for immunization: Secondary | ICD-10-CM

## 2013-10-22 DIAGNOSIS — C50911 Malignant neoplasm of unspecified site of right female breast: Secondary | ICD-10-CM

## 2013-10-22 DIAGNOSIS — Z1211 Encounter for screening for malignant neoplasm of colon: Secondary | ICD-10-CM

## 2013-10-22 DIAGNOSIS — Z923 Personal history of irradiation: Secondary | ICD-10-CM | POA: Diagnosis not present

## 2013-10-22 DIAGNOSIS — Z79899 Other long term (current) drug therapy: Secondary | ICD-10-CM

## 2013-10-22 DIAGNOSIS — C50919 Malignant neoplasm of unspecified site of unspecified female breast: Secondary | ICD-10-CM | POA: Diagnosis not present

## 2013-10-22 LAB — COMPREHENSIVE METABOLIC PANEL
ALBUMIN: 4.1 g/dL (ref 3.5–5.2)
ALK PHOS: 67 U/L (ref 39–117)
ALT: 28 U/L (ref 0–35)
AST: 22 U/L (ref 0–37)
BILIRUBIN TOTAL: 0.6 mg/dL (ref 0.2–1.2)
BUN: 20 mg/dL (ref 6–23)
CO2: 29 meq/L (ref 19–32)
Calcium: 9.7 mg/dL (ref 8.4–10.5)
Chloride: 105 mEq/L (ref 96–112)
Creat: 0.76 mg/dL (ref 0.50–1.10)
GLUCOSE: 98 mg/dL (ref 70–99)
POTASSIUM: 4.5 meq/L (ref 3.5–5.3)
SODIUM: 142 meq/L (ref 135–145)
Total Protein: 6.8 g/dL (ref 6.0–8.3)

## 2013-10-22 LAB — LIPID PANEL
Cholesterol: 125 mg/dL (ref 0–200)
HDL: 61 mg/dL (ref >39)
LDL Cholesterol: 52 mg/dL (ref 0–99)
TRIGLYCERIDES: 62 mg/dL (ref <150)
Total CHOL/HDL Ratio: 2 Ratio
VLDL: 12 mg/dL (ref 0–40)

## 2013-10-22 MED ORDER — ATORVASTATIN CALCIUM 20 MG PO TABS: ORAL_TABLET | ORAL | Status: DC

## 2013-10-22 NOTE — Progress Notes (Signed)
Subjective:    Patient ID: Tamara Evans, female    DOB: [birth year], 68 y.o.   MRN: [id number]  HPI She is here for medication check. She has had some right knee pain especially with going down steps and sitting for long periods of time. No locking grinding or swelling. She continues to be followed by oncology for her underlying breast cancer and is on medications listed in the chart. Her last DEXA scan was approximately 2 years ago. She continues on her thyroid medicine and is having no difficulty with that. She gets yearly followups on that. She continues on Lipitor and is having no difficulty with that medication. She was apparently scheduled for colonoscopy however did not get it. She has no other concerns or complaints. She is getting ready to retire and at this time does not have a clear plan on what she will do in her retirement.   Review of Systems  All other systems reviewed and are negative.      Objective:   Physical Exam BP 124/72  Pulse 68  Ht 5' 3.5" (1.613 m)  Wt 152 lb (68.947 kg)  BMI 26.50 kg/m2  SpO2 98%  General Appearance:    Alert, cooperative, no distress, appears stated age  Head:    Normocephalic, without obvious abnormality, atraumatic  Eyes:    PERRL, conjunctiva/corneas clear, EOM's intact, fundi    benign  Ears:    Normal TM's and external ear canals  Nose:   Nares normal, mucosa normal, no drainage or sinus   tenderness  Throat:   Lips, mucosa, and tongue normal; teeth and gums normal  Neck:   Supple, no lymphadenopathy;  thyroid:  no   enlargement/tenderness/nodules; no carotid   bruit or JVD  Back:    Spine nontender, no curvature, ROM normal, no CVA     tenderness  Lungs:     Clear to auscultation bilaterally without wheezes, rales or     ronchi; respirations unlabored  Chest Wall:    No tenderness or deformity   Heart:    Regular rate and rhythm, S1 and S2 normal, no murmur, rub   or gallop  Breast Exam:    Deferred to GYN  Abdomen:     Soft,  non-tender, nondistended, normoactive bowel sounds,    no masses, no hepatosplenomegaly  Genitalia:    Deferred to GYN     Extremities:   No clubbing, cyanosis or edema. Right knee exam does show positive compression test with slight tenderness to the medial pole of the patella. Anterior drawer and brace testing negative.   Pulses:   2+ and symmetric all extremities  Skin:   Skin color, texture, turgor normal, no rashes or lesions  Lymph nodes:   Cervical, supraclavicular, and axillary nodes normal  Neurologic:   CNII-XII intact, normal strength, sensation and gait; reflexes 2+ and symmetric throughout          Psych:   Normal mood, affect, hygiene and grooming.          Assessment & Plan:  Breast cancer, right - Plan: CBC with Differential, Comprehensive metabolic panel  Need for prophylactic vaccination against Streptococcus pneumoniae (pneumococcus) - Plan: Pneumococcal conjugate vaccine 13-valent  Need for prophylactic vaccination and inoculation against influenza - Plan: Flu vaccine HIGH DOSE PF (Fluzone Tri High dose)  Pure hypercholesterolemia - Plan: atorvastatin (LIPITOR) 20 MG tablet, Lipid panel  Osteopenia - Plan: DG Bone Density  Unspecified hypothyroidism  Hx of radiation therapy  Special  screening for malignant neoplasms, colon - Plan: Ambulatory referral to Gastroenterology  Encounter for long-term (current) use of other medications - Plan: CBC with Differential, Comprehensive metabolic panel, Lipid panel  Chondromalacia, patella, right  instructed her on terminal extension exercises to help with her knee. Encouraged her to set up again plan for when she retires to keep both her mind and body busy. Immunizations were updated. She is set up for colonoscopy.

## 2013-10-22 NOTE — Patient Instructions (Signed)
3 sets of 10 3 times a day for the next 3 weeks.

## 2013-10-23 ENCOUNTER — Telehealth: Payer: Self-pay | Admitting: Hematology and Oncology

## 2013-10-23 LAB — CBC WITH DIFFERENTIAL/PLATELET
Basophils Absolute: 0 10*3/uL (ref 0.0–0.1)
Basophils Relative: 0 % (ref 0–1)
EOS ABS: 0.2 10*3/uL (ref 0.0–0.7)
Eosinophils Relative: 3 % (ref 0–5)
HCT: 42.5 % (ref 36.0–46.0)
HEMOGLOBIN: 14.7 g/dL (ref 12.0–15.0)
LYMPHS ABS: 1.3 10*3/uL (ref 0.7–4.0)
Lymphocytes Relative: 25 % (ref 12–46)
MCH: 25.9 pg — ABNORMAL LOW (ref 26.0–34.0)
MCHC: 34.6 g/dL (ref 30.0–36.0)
MCV: 74.8 fL — ABNORMAL LOW (ref 78.0–100.0)
MONO ABS: 0.6 10*3/uL (ref 0.1–1.0)
MONOS PCT: 11 % (ref 3–12)
NEUTROS PCT: 61 % (ref 43–77)
Neutro Abs: 3.1 10*3/uL (ref 1.7–7.7)
Platelets: 200 10*3/uL (ref 150–400)
RBC: 5.68 MIL/uL — AB (ref 3.87–5.11)
RDW: 14.1 % (ref 11.5–15.5)
WBC: 5.1 10*3/uL (ref 4.0–10.5)

## 2013-10-23 NOTE — Telephone Encounter (Signed)
s/w pt re new appt for lb/fu 11/9. also confirmed 10/2 inj appt. pt picked up line while message was beinging left and was given appts.

## 2013-10-25 ENCOUNTER — Encounter: Payer: Self-pay | Admitting: Internal Medicine

## 2013-10-29 ENCOUNTER — Ambulatory Visit (HOSPITAL_BASED_OUTPATIENT_CLINIC_OR_DEPARTMENT_OTHER): Payer: Medicare Other

## 2013-10-29 VITALS — BP 127/66 | HR 74 | Temp 97.9°F

## 2013-10-29 DIAGNOSIS — M8580 Other specified disorders of bone density and structure, unspecified site: Secondary | ICD-10-CM

## 2013-10-29 DIAGNOSIS — M899 Disorder of bone, unspecified: Secondary | ICD-10-CM

## 2013-10-29 MED ORDER — DENOSUMAB 60 MG/ML ~~LOC~~ SOLN
60.0000 mg | Freq: Once | SUBCUTANEOUS | Status: DC
  Filled 2013-10-29: qty 1

## 2013-10-29 MED ORDER — DENOSUMAB 60 MG/ML ~~LOC~~ SOLN
60.0000 mg | Freq: Once | SUBCUTANEOUS | Status: DC
  Administered 2013-10-29: 60 mg via SUBCUTANEOUS

## 2013-11-25 ENCOUNTER — Other Ambulatory Visit: Payer: Self-pay | Admitting: Family Medicine

## 2013-11-25 DIAGNOSIS — Z9889 Other specified postprocedural states: Secondary | ICD-10-CM

## 2013-11-25 DIAGNOSIS — Z853 Personal history of malignant neoplasm of breast: Secondary | ICD-10-CM

## 2013-12-06 ENCOUNTER — Telehealth: Payer: Self-pay | Admitting: Hematology and Oncology

## 2013-12-06 ENCOUNTER — Other Ambulatory Visit: Payer: Medicare Other

## 2013-12-06 ENCOUNTER — Ambulatory Visit (HOSPITAL_BASED_OUTPATIENT_CLINIC_OR_DEPARTMENT_OTHER): Payer: Medicare Other | Admitting: Hematology and Oncology

## 2013-12-06 VITALS — BP 152/63 | HR 84 | Temp 98.0°F | Resp 18 | Ht 63.5 in | Wt 153.5 lb

## 2013-12-06 DIAGNOSIS — M8580 Other specified disorders of bone density and structure, unspecified site: Secondary | ICD-10-CM | POA: Diagnosis not present

## 2013-12-06 DIAGNOSIS — C50411 Malignant neoplasm of upper-outer quadrant of right female breast: Secondary | ICD-10-CM

## 2013-12-06 DIAGNOSIS — C50511 Malignant neoplasm of lower-outer quadrant of right female breast: Secondary | ICD-10-CM

## 2013-12-06 NOTE — Progress Notes (Signed)
Patient Care Team: Rita Ohara, MD as PCP - General (Family Medicine)  DIAGNOSIS: 68 year old female with invasive mammary carcinoma of the right breast diagnosed 09/18/2010   SUMMARY OF ONCOLOGIC HISTORY: #1 right breast lumpectomy that revealed a 0.8 cm invasive ductal carcinoma grade 2. 2 sentinel nodes were negative for metastatic disease. The tumor was ER +100% PR +40% proliferation marker 64% with HER-2/neu not amplified with a ratio of 1.07.  #2 patient is status post radiation therapy administered by Dr. Gery Pray.  #3 patient had an Oncotype DX performed that showed a recurrence score of 23 putting her in the intermediate risk category with a 5 year risk of distant recurrence at about sent with tamoxifen therapy.  #4 patient was begun on letrozole 2.5 mg daily. She is tolerating this well. Total of 5 years of therapy is planned.  #5 begin prolia q 6 months starting 09/2012. Stopped 10/2013 Due to bone aches and pains  CURRENT THERAPY: letrozole 2.5 mg daily./prolia q 6 months (04/30/13)  CHIEF COMPLIANT: diffuse body aches and pains especially the bones  INTERVAL HISTORY: Tamara Evans is a 68 year old Caucasian with above-mentioned history of breast cancer that was treated with lumpectomy and radiation currently on antiestrogen therapy with letrozole. She is tolerating letrozole extremely well without any major problems or concerns. She is scheduled for a mammogram to be done in December 2015. She was found to have osteopenia and was started on early. It appears that previously she was on Fosamax and was working quite well. On Prolia she is having a lot more body aches and pains.  REVIEW OF SYSTEMS:   Constitutional: Denies fevers, chills or abnormal weight loss Eyes: Denies blurriness of vision Ears, nose, mouth, throat, and face: Denies mucositis or sore throat Respiratory: Denies cough, dyspnea or wheezes Cardiovascular: Denies palpitation, chest discomfort or lower extremity  swelling Gastrointestinal:  Denies nausea, heartburn or change in bowel habits Skin: Denies abnormal skin rashes Lymphatics: Denies new lymphadenopathy or easy bruising Neurological:Denies numbness, tingling or new weaknesses Behavioral/Psych: Mood is stable, no new changes  Breast:  denies any pain or lumps or nodules in either breasts All other systems were reviewed with the patient and are negative.  I have reviewed the past medical history, past surgical history, social history and family history with the patient and they are unchanged from previous note.  ALLERGIES:  is allergic to codeine.  MEDICATIONS:  Current Outpatient Prescriptions  Medication Sig Dispense Refill  . aspirin 81 MG tablet Take 81 mg by mouth daily.      Marland Kitchen atorvastatin (LIPITOR) 20 MG tablet Take one tablet by mouth one time daily 90 tablet 3  . calcium carbonate (OS-CAL) 600 MG TABS Take 600 mg by mouth 2 (two) times daily with a meal.      . denosumab (PROLIA) 60 MG/ML SOLN injection Inject 60 mg into the skin every 6 (six) months. Administer in upper arm, thigh, or abdomen    . fish oil-omega-3 fatty acids 1000 MG capsule Take 1 g by mouth daily.      Marland Kitchen letrozole (FEMARA) 2.5 MG tablet Take one tablet by mouth one time daily 90 tablet 1  . levothyroxine (SYNTHROID, LEVOTHROID) 100 MCG tablet Take 100 mcg by mouth daily.     . Multiple Vitamin (MULTIVITAMIN) tablet Take 1 tablet by mouth daily.       No current facility-administered medications for this visit.    PHYSICAL EXAMINATION: ECOG PERFORMANCE STATUS: 0 - Asymptomatic  Filed Vitals:  12/06/13 1037  BP: 152/63  Pulse: 84  Temp: 98 F (36.7 C)  Resp: 18   Filed Weights   12/06/13 1037  Weight: 153 lb 8 oz (69.627 kg)    GENERAL:alert, no distress and comfortable SKIN: skin color, texture, turgor are normal, no rashes or significant lesions EYES: normal, Conjunctiva are pink and non-injected, sclera clear OROPHARYNX:no exudate, no  erythema and lips, buccal mucosa, and tongue normal  NECK: supple, thyroid normal size, non-tender, without nodularity LYMPH:  no palpable lymphadenopathy in the cervical, axillary or inguinal LUNGS: clear to auscultation and percussion with normal breathing effort HEART: regular rate & rhythm and no murmurs and no lower extremity edema ABDOMEN:abdomen soft, non-tender and normal bowel sounds Musculoskeletal:no cyanosis of digits and no clubbing  NEURO: alert & oriented x 3 with fluent speech, no focal motor/sensory deficits BREAST: No palpable masses or nodules in either right or left breasts. No palpable axillary supraclavicular or infraclavicular adenopathy no breast tenderness or nipple discharge.   LABORATORY DATA:  I have reviewed the data as listed   Chemistry      Component Value Date/Time   NA 142 10/22/2013 1024   NA 146* 10/09/2012 0957   K 4.5 10/22/2013 1024   K 3.5 10/09/2012 0957   CL 105 10/22/2013 1024   CL 105 12/10/2011 0853   CO2 29 10/22/2013 1024   CO2 30* 10/09/2012 0957   BUN 20 10/22/2013 1024   BUN 27.2* 10/09/2012 0957   CREATININE 0.76 10/22/2013 1024   CREATININE 0.8 10/09/2012 0957   CREATININE 0.75 05/20/2011 0929      Component Value Date/Time   CALCIUM 9.7 10/22/2013 1024   CALCIUM 9.5 10/09/2012 0957   ALKPHOS 67 10/22/2013 1024   ALKPHOS 77 10/09/2012 0957   AST 22 10/22/2013 1024   AST 21 10/09/2012 0957   ALT 28 10/22/2013 1024   ALT 31 10/09/2012 0957   BILITOT 0.6 10/22/2013 1024   BILITOT 0.64 10/09/2012 0957       Lab Results  Component Value Date   WBC 5.1 10/22/2013   HGB 14.7 10/22/2013   HCT 42.5 10/22/2013   MCV 74.8* 10/22/2013   PLT 200 10/22/2013   NEUTROABS 3.1 10/22/2013   ASSESSMENT & PLAN:  Breast cancer of lower-outer quadrant of right female breast Right breast cancer invasive ductal carcinoma grade 2, 0.8 cm, 2 SLN negative, yet 100%, PR 40%, Ki-67 64%, HER-2 negative ratio 1.07 status post lumpectomy  followed by radiation, Oncotype DX recurrence score 23 intermediate risk, did not get chemotherapy, letrozole 2.5 mg daily started October 2012.  Aromatase inhibitor toxicities: Patient has some diffuse muscle aches and pains but otherwise tolerating it very well.  Surveillance: Today's breast exam was normal. Her mammograms will be scheduled for December 2015. Survivorship: Encouraged her to stay active physical exercise and eat more fruits and vegetables and less red meat.    Osteopenia Osteopenia: Patient is on Prolia every 6 months with calcium and vitamin D. She would like to take Fosamax instead because when he causes for bone aches and pains. I will discontinue further Prolia treatments. In about 5 months she will need to start oral bisphosphonate treatments. She plans to change insurance plan. At that point we will decide whether she can go on Fosamax or Boniva.  Patient is retiring at the end of December. She is currently working at [employer]. Laboratory for  No orders of the defined types were placed in this encounter.   The patient  has a good understanding of the overall plan. she agrees with it. She will call with any problems that may develop before her next visit here.  I spent 15 minutes counseling the patient face to face. The total time spent in the appointment was 15 minutes and more than 50% was on counseling and review of test results    Rulon Eisenmenger, MD 12/06/2013 11:34 AM

## 2013-12-06 NOTE — Telephone Encounter (Signed)
per pof to sch appt-gave pt copy of sch °

## 2013-12-06 NOTE — Assessment & Plan Note (Signed)
Osteopenia: Patient is on Prolia every 6 months with calcium and vitamin D. She would like to take Fosamax instead because when he causes for bone aches and pains. I will discontinue further Prolia treatments. In about 5 months she will need to start oral bisphosphonate treatments. She plans to change insurance plan. At that point we will decide whether she can go on Fosamax or Boniva.

## 2013-12-06 NOTE — Assessment & Plan Note (Signed)
Right breast cancer invasive ductal carcinoma grade 2, 0.8 cm, 2 SLN negative, yet 100%, PR 40%, Ki-67 64%, HER-2 negative ratio 1.07 status post lumpectomy followed by radiation, Oncotype DX recurrence score 23 intermediate risk, did not get chemotherapy, letrozole 2.5 mg daily started October 2012.  Aromatase inhibitor toxicities: Patient has some diffuse muscle aches and pains but otherwise tolerating it very well.  Surveillance: Today's breast exam was normal. Her mammograms will be scheduled for December 2015. Survivorship: Encouraged her to stay active physical exercise and eat more fruits and vegetables and less red meat.

## 2013-12-07 DIAGNOSIS — H40013 Open angle with borderline findings, low risk, bilateral: Secondary | ICD-10-CM | POA: Diagnosis not present

## 2013-12-20 ENCOUNTER — Ambulatory Visit (AMBULATORY_SURGERY_CENTER): Payer: Self-pay | Admitting: *Deleted

## 2013-12-20 VITALS — Ht 63.0 in | Wt 155.0 lb

## 2013-12-20 DIAGNOSIS — Z1211 Encounter for screening for malignant neoplasm of colon: Secondary | ICD-10-CM

## 2013-12-20 NOTE — Progress Notes (Signed)
Patient denies any allergies to eggs or soy. Patient denies any problems with anesthesia/sedation. Pt states "sluggish after sedation". Patient denies any oxygen use at home and does not take any diet/weight loss medications. EMMI education assisgned to patient on colonoscopy, this was explained and instructions given to patient.

## 2013-12-31 ENCOUNTER — Ambulatory Visit
Admission: RE | Admit: 2013-12-31 | Discharge: 2013-12-31 | Disposition: A | Discharge status: Home / Self Care | Payer: Medicare Other | Source: Ambulatory Visit | Attending: Family Medicine | Admitting: Family Medicine

## 2013-12-31 DIAGNOSIS — Z9889 Other specified postprocedural states: Secondary | ICD-10-CM

## 2013-12-31 DIAGNOSIS — Z853 Personal history of malignant neoplasm of breast: Secondary | ICD-10-CM | POA: Diagnosis not present

## 2014-01-03 ENCOUNTER — Ambulatory Visit (AMBULATORY_SURGERY_CENTER): Payer: Medicare Other | Admitting: Internal Medicine

## 2014-01-03 ENCOUNTER — Encounter: Payer: Self-pay | Admitting: Internal Medicine

## 2014-01-03 VITALS — BP 140/76 | HR 69 | Temp 98.0°F | Resp 45 | Ht 63.0 in | Wt 155.0 lb

## 2014-01-03 DIAGNOSIS — K5730 Diverticulosis of large intestine without perforation or abscess without bleeding: Secondary | ICD-10-CM

## 2014-01-03 DIAGNOSIS — Z1211 Encounter for screening for malignant neoplasm of colon: Secondary | ICD-10-CM | POA: Diagnosis not present

## 2014-01-03 MED ORDER — SODIUM CHLORIDE 0.9 % IV SOLN: 500.0000 mL | INTRAVENOUS | Status: DC

## 2014-01-03 NOTE — Patient Instructions (Signed)

## 2014-01-03 NOTE — Progress Notes (Signed)
A/ox3, pleased with MAC, report to RN 

## 2014-01-04 ENCOUNTER — Telehealth: Payer: Self-pay | Admitting: *Deleted

## 2014-01-04 NOTE — Telephone Encounter (Signed)
No answer, message left for the patient. Discard previous phone note.

## 2014-01-04 NOTE — Telephone Encounter (Signed)
  Follow up Call-  Call back number 01/03/2014  Post procedure Call Back phone  # [phone]  Permission to leave phone message Yes     Patient questions:  Do you have a fever, pain , or abdominal swelling? No. Pain Score  0 *  Have you tolerated food without any problems? Yes.    Have you been able to return to your normal activities? Yes.    Do you have any questions about your discharge instructions: Diet   No. Medications  No. Follow up visit  No.  Do you have questions or concerns about your Care? No.  Actions: * If pain score is 4 or above: No action needed, pain <4.

## 2014-01-05 NOTE — Op Note (Signed)
[place]  [address] [place], [id number]   COLONOSCOPY PROCEDURE REPORT  PATIENT: Madolin, Twaddle  MR#: [id number] BIRTHDATE: 19-Feb-1945 , 68  yrs. old GENDER: female ENDOSCOPIST: Gatha Mayer, MD, [facility] PROCEDURE DATE:  01/03/2014 PROCEDURE:   Colonoscopy, screening First Screening Colonoscopy - Avg.  risk and is 50 yrs.  old or older - No.  Prior Negative Screening - Now for repeat screening. 10 or more years since last screening  History of Adenoma - Now for follow-up colonoscopy & has been > or = to 3 yrs.  N/A  Polyps Removed Today? No.  Polyps Removed Today? No.  Recommend repeat exam, <10 yrs? Polyps Removed Today? No.  Recommend repeat exam, <10 yrs? No. ASA CLASS:   Class III INDICATIONS:average risk for colorectal cancer. MEDICATIONS: Propofol 200 mg IV and Monitored anesthesia care  DESCRIPTION OF PROCEDURE:   After the risks benefits and alternatives of the procedure were thoroughly explained, informed consent was obtained.  The digital rectal exam revealed no abnormalities of the rectum.   The LB [id number]  endoscope was introduced through the anus and advanced to the cecum, which was identified by both the appendix and ileocecal valve. No adverse events experienced.   The quality of the prep was excellent, using MiraLax  The instrument was then slowly withdrawn as the colon was fully examined.      COLON FINDINGS: 1) Sigmoid diverticulosis 2) Otherwise normal exam of the entire colon and rectum.  Retroflexed views revealed no abnormalities. The time to cecum=1 minutes 55 seconds.  Withdrawal time=11 minutes 07 seconds.  The scope was withdrawn and the procedure completed. COMPLICATIONS: There were no immediate complications.  ENDOSCOPIC IMPRESSION: 1) Sigmoid diverticulosis 2) Otherwise normal exam of the entire colon and rectum - excellent prep  RECOMMENDATIONS: Consider repeating a colonoscopy in 10 years - 2025 will be  78 so would be optional per current guidelines  eSigned:  Gatha Mayer, MD, [facility] 01/03/2014 9:46 AM   cc: Jill Alexanders, MD and The Patient

## 2014-01-10 DIAGNOSIS — M8580 Other specified disorders of bone density and structure, unspecified site: Secondary | ICD-10-CM | POA: Diagnosis not present

## 2014-01-10 LAB — HM DEXA SCAN

## 2014-01-13 ENCOUNTER — Encounter: Payer: Self-pay | Admitting: Internal Medicine

## 2014-02-23 ENCOUNTER — Other Ambulatory Visit: Payer: Self-pay

## 2014-02-23 DIAGNOSIS — C50511 Malignant neoplasm of lower-outer quadrant of right female breast: Secondary | ICD-10-CM

## 2014-02-23 MED ORDER — LETROZOLE 2.5 MG PO TABS: 2.5000 mg | ORAL_TABLET | Freq: Every day | ORAL | Status: DC

## 2014-04-04 ENCOUNTER — Other Ambulatory Visit: Payer: Self-pay | Admitting: Family Medicine

## 2014-04-04 ENCOUNTER — Encounter: Payer: Self-pay | Admitting: Family Medicine

## 2014-04-04 MED ORDER — ALENDRONATE SODIUM 70 MG PO TABS: 70.0000 mg | ORAL_TABLET | ORAL | Status: DC

## 2014-06-12 NOTE — Assessment & Plan Note (Signed)
Right breast cancer invasive ductal carcinoma grade 2, 0.8 cm, 2 SLN negative, yet 100%, PR 40%, Ki-67 64%, HER-2 negative ratio 1.07 status post lumpectomy followed by radiation, Oncotype DX recurrence score 23 intermediate risk, did not get chemotherapy, letrozole 2.5 mg daily started October 2012.  AI Toxicities:  Breast Cancer Surveillance: 1. Breast exam 06/13/14: Normal 2. Mammogram 12/31/13 No abnormalities. Postsurgical changes. Breast Density Category C. I recommended that she get 3-D mammograms for surveillance. Discussed the differences between different breast density categories.  Osteopenia: Was on Prolia. We discontinued it due to aches and pains. Plan to use oral bisphosphonates. Bone density 01/11/15:T score -1.9 on calcium and Vit D

## 2014-06-13 ENCOUNTER — Telehealth: Payer: Self-pay | Admitting: Hematology and Oncology

## 2014-06-13 ENCOUNTER — Ambulatory Visit (HOSPITAL_BASED_OUTPATIENT_CLINIC_OR_DEPARTMENT_OTHER): Payer: Medicare PPO | Admitting: Hematology and Oncology

## 2014-06-13 VITALS — BP 137/68 | HR 79 | Temp 98.0°F | Resp 18 | Ht 63.0 in | Wt 152.8 lb

## 2014-06-13 DIAGNOSIS — L598 Other specified disorders of the skin and subcutaneous tissue related to radiation: Secondary | ICD-10-CM

## 2014-06-13 DIAGNOSIS — C50511 Malignant neoplasm of lower-outer quadrant of right female breast: Secondary | ICD-10-CM

## 2014-06-13 DIAGNOSIS — M810 Age-related osteoporosis without current pathological fracture: Secondary | ICD-10-CM

## 2014-06-13 DIAGNOSIS — M791 Myalgia: Secondary | ICD-10-CM | POA: Diagnosis not present

## 2014-06-13 DIAGNOSIS — C50412 Malignant neoplasm of upper-outer quadrant of left female breast: Secondary | ICD-10-CM

## 2014-06-13 NOTE — Progress Notes (Signed)
Patient Care Team: Rita Ohara, MD as PCP - General (Family Medicine)  DIAGNOSIS: No matching staging information was found for the patient.  SUMMARY OF ONCOLOGIC HISTORY:   Breast cancer of lower-outer quadrant of right female breast   10/05/2010 Surgery Right breast lumpectomy 0.8 cm invasive ductal carcinoma grade 2. 0/2 SLN. ER +100% PR +40% Ki 67: 64% with HER-2/neu Neg ratio of 1.07; Oncotype 23   11/12/2010 - 12/25/2010 Radiation Therapy Adjuvant XRT   01/12/2011 -  Anti-estrogen oral therapy Letrozole 2.5 mg daily    CHIEF COMPLIANT: Follow-up right breast cancer  INTERVAL HISTORY: Tamara Evans is a 69 year old with above-mentioned his right breast cancer currently letrozole. She has muscle aches and pains related to letrozole. She could also be due to her cholesterol medication. She stopped using Prolia and is now on Fosamax for osteoporosis.  REVIEW OF SYSTEMS:   Constitutional: Denies fevers, chills or abnormal weight loss Eyes: Denies blurriness of vision Ears, nose, mouth, throat, and face: Denies mucositis or sore throat Respiratory: Denies cough, dyspnea or wheezes Cardiovascular: Denies palpitation, chest discomfort or lower extremity swelling Gastrointestinal:  Denies nausea, heartburn or change in bowel habits Skin: Denies abnormal skin rashes Lymphatics: Denies new lymphadenopathy or easy bruising Neurological:Denies numbness, tingling or new weaknesses Behavioral/Psych: Mood is stable, no new changes  Breast:  Radiation dermatitis  All other systems were reviewed with the patient and are negative.  I have reviewed the past medical history, past surgical history, social history and family history with the patient and they are unchanged from previous note.  ALLERGIES:  is allergic to codeine.  MEDICATIONS:  Current Outpatient Prescriptions  Medication Sig Dispense Refill  . alendronate (FOSAMAX) 70 MG tablet Take 1 tablet (70 mg total) by mouth every 7 (seven)  days. Take with a full glass of water on an empty stomach. 4 tablet 11  . aspirin 81 MG tablet Take 81 mg by mouth daily.      Marland Kitchen atorvastatin (LIPITOR) 20 MG tablet Take one tablet by mouth one time daily 90 tablet 3  . calcium carbonate (OS-CAL) 600 MG TABS Take 600 mg by mouth 2 (two) times daily with a meal.      . fish oil-omega-3 fatty acids 1000 MG capsule Take 1 g by mouth daily.      Marland Kitchen letrozole (FEMARA) 2.5 MG tablet Take 1 tablet (2.5 mg total) by mouth daily. 90 tablet 1  . Multiple Vitamin (MULTIVITAMIN) tablet Take 1 tablet by mouth daily.      Marland Kitchen SYNTHROID 100 MCG tablet     . denosumab (PROLIA) 60 MG/ML SOLN injection Inject 60 mg into the skin every 6 (six) months. Administer in upper arm, thigh, or abdomen     No current facility-administered medications for this visit.    PHYSICAL EXAMINATION: ECOG PERFORMANCE STATUS: 1 - Symptomatic but completely ambulatory  Filed Vitals:   06/13/14 0946  BP: 137/68  Pulse: 79  Temp: 98 F (36.7 C)  Resp: 18   Filed Weights   06/13/14 0946  Weight: 152 lb 12.8 oz (69.31 kg)    GENERAL:alert, no distress and comfortable SKIN: skin color, texture, turgor are normal, no rashes or significant lesions EYES: normal, Conjunctiva are pink and non-injected, sclera clear OROPHARYNX:no exudate, no erythema and lips, buccal mucosa, and tongue normal  NECK: supple, thyroid normal size, non-tender, without nodularity LYMPH:  no palpable lymphadenopathy in the cervical, axillary or inguinal LUNGS: clear to auscultation and percussion with normal breathing effort  HEART: regular rate & rhythm and no murmurs and no lower extremity edema ABDOMEN:abdomen soft, non-tender and normal bowel sounds Musculoskeletal:no cyanosis of digits and no clubbing  NEURO: alert & oriented x 3 with fluent speech, no focal motor/sensory deficits  LABORATORY DATA:  I have reviewed the data as listed   Chemistry      Component Value Date/Time   NA 142  10/22/2013 1024   NA 146* 10/09/2012 0957   K 4.5 10/22/2013 1024   K 3.5 10/09/2012 0957   CL 105 10/22/2013 1024   CL 105 12/10/2011 0853   CO2 29 10/22/2013 1024   CO2 30* 10/09/2012 0957   BUN 20 10/22/2013 1024   BUN 27.2* 10/09/2012 0957   CREATININE 0.76 10/22/2013 1024   CREATININE 0.8 10/09/2012 0957   CREATININE 0.75 05/20/2011 0929      Component Value Date/Time   CALCIUM 9.7 10/22/2013 1024   CALCIUM 9.5 10/09/2012 0957   ALKPHOS 67 10/22/2013 1024   ALKPHOS 77 10/09/2012 0957   AST 22 10/22/2013 1024   AST 21 10/09/2012 0957   ALT 28 10/22/2013 1024   ALT 31 10/09/2012 0957   BILITOT 0.6 10/22/2013 1024   BILITOT 0.64 10/09/2012 0957       Lab Results  Component Value Date   WBC 5.1 10/22/2013   HGB 14.7 10/22/2013   HCT 42.5 10/22/2013   MCV 74.8* 10/22/2013   PLT 200 10/22/2013   NEUTROABS 3.1 10/22/2013    ASSESSMENT & PLAN:  Breast cancer of lower-outer quadrant of right female breast Right breast cancer invasive ductal carcinoma grade 2, 0.8 cm, 2 SLN negative, yet 100%, PR 40%, Ki-67 64%, HER-2 negative ratio 1.07 status post lumpectomy followed by radiation, Oncotype DX recurrence score 23 intermediate risk, did not get chemotherapy, letrozole 2.5 mg daily started Dec 2012.  AI Toxicities: 1. Muscle aches and pains  Breast Cancer Surveillance: 1. Breast exam 06/13/14: Normal 2. Mammogram 12/31/13 No abnormalities. Postsurgical changes. Breast Density Category C. I recommended that she get 3-D mammograms for surveillance. Discussed the differences between different breast density categories.  Osteopenia: Was on Prolia. We discontinued it due to aches and pains. Plan to use oral bisphosphonates. Bone density 01/11/15:T score -1.9 on calcium and Vit D  Radiation dermatitis: I encouraged her to use Aquaphor Follow-up in one year  No orders of the defined types were placed in this encounter.   The patient has a good understanding of the overall  plan. she agrees with it. she will call with any problems that may develop before the next visit here.   Rulon Eisenmenger, MD

## 2014-06-13 NOTE — Telephone Encounter (Signed)
Mailed a one year calendar/appointment to patient

## 2014-08-06 ENCOUNTER — Other Ambulatory Visit: Payer: Self-pay | Admitting: Family Medicine

## 2014-08-08 ENCOUNTER — Ambulatory Visit
Admission: RE | Admit: 2014-08-08 | Discharge: 2014-08-08 | Disposition: A | Discharge status: Home / Self Care | Payer: Medicare PPO | Source: Ambulatory Visit | Attending: Endocrinology | Admitting: Endocrinology

## 2014-08-08 DIAGNOSIS — E049 Nontoxic goiter, unspecified: Secondary | ICD-10-CM

## 2014-08-14 ENCOUNTER — Other Ambulatory Visit: Payer: Self-pay | Admitting: Hematology and Oncology

## 2014-08-15 NOTE — Telephone Encounter (Signed)
last ov 06/13/14.  Next ov 06/14/15.  Chart reviewed.

## 2014-08-17 ENCOUNTER — Other Ambulatory Visit: Payer: Self-pay | Admitting: Endocrinology

## 2014-08-17 DIAGNOSIS — E049 Nontoxic goiter, unspecified: Secondary | ICD-10-CM

## 2014-08-22 ENCOUNTER — Other Ambulatory Visit: Payer: Medicare Other

## 2014-10-07 ENCOUNTER — Encounter: Payer: Self-pay | Admitting: Family Medicine

## 2014-10-24 ENCOUNTER — Ambulatory Visit (INDEPENDENT_AMBULATORY_CARE_PROVIDER_SITE_OTHER): Payer: Medicare PPO | Admitting: Family Medicine

## 2014-10-24 ENCOUNTER — Encounter: Payer: Self-pay | Admitting: Family Medicine

## 2014-10-24 VITALS — BP 118/70 | HR 78 | Ht 64.0 in | Wt 153.0 lb

## 2014-10-24 DIAGNOSIS — C50911 Malignant neoplasm of unspecified site of right female breast: Secondary | ICD-10-CM

## 2014-10-24 DIAGNOSIS — R251 Tremor, unspecified: Secondary | ICD-10-CM

## 2014-10-24 DIAGNOSIS — M8580 Other specified disorders of bone density and structure, unspecified site: Secondary | ICD-10-CM

## 2014-10-24 DIAGNOSIS — E038 Other specified hypothyroidism: Secondary | ICD-10-CM | POA: Diagnosis not present

## 2014-10-24 DIAGNOSIS — M6530 Trigger finger, unspecified finger: Secondary | ICD-10-CM | POA: Diagnosis not present

## 2014-10-24 DIAGNOSIS — E7800 Pure hypercholesterolemia, unspecified: Secondary | ICD-10-CM

## 2014-10-24 DIAGNOSIS — Z23 Encounter for immunization: Secondary | ICD-10-CM

## 2014-10-24 LAB — COMPREHENSIVE METABOLIC PANEL
ALBUMIN: 4 g/dL (ref 3.6–5.1)
ALK PHOS: 69 U/L (ref 33–130)
ALT: 27 U/L (ref 6–29)
AST: 22 U/L (ref 10–35)
BILIRUBIN TOTAL: 0.4 mg/dL (ref 0.2–1.2)
BUN: 24 mg/dL (ref 7–25)
CALCIUM: 9.5 mg/dL (ref 8.6–10.4)
CO2: 30 mmol/L (ref 20–31)
CREATININE: 0.77 mg/dL (ref 0.50–0.99)
Chloride: 102 mmol/L (ref 98–110)
Glucose, Bld: 83 mg/dL (ref 65–99)
Potassium: 4 mmol/L (ref 3.5–5.3)
SODIUM: 138 mmol/L (ref 135–146)
Total Protein: 6.6 g/dL (ref 6.1–8.1)

## 2014-10-24 LAB — CBC WITH DIFFERENTIAL/PLATELET
Basophils Absolute: 0 10*3/uL (ref 0.0–0.1)
Basophils Relative: 0 % (ref 0–1)
Eosinophils Absolute: 0.2 10*3/uL (ref 0.0–0.7)
Eosinophils Relative: 3 % (ref 0–5)
HCT: 43 % (ref 36.0–46.0)
HEMOGLOBIN: 14.3 g/dL (ref 12.0–15.0)
Lymphocytes Relative: 23 % (ref 12–46)
Lymphs Abs: 1.2 10*3/uL (ref 0.7–4.0)
MCH: 26.3 pg (ref 26.0–34.0)
MCHC: 33.3 g/dL (ref 30.0–36.0)
MCV: 79.2 fL (ref 78.0–100.0)
MONOS PCT: 11 % (ref 3–12)
MPV: 9.8 fL (ref 8.6–12.4)
Monocytes Absolute: 0.6 10*3/uL (ref 0.1–1.0)
NEUTROS ABS: 3.4 10*3/uL (ref 1.7–7.7)
Neutrophils Relative %: 63 % (ref 43–77)
PLATELETS: 203 10*3/uL (ref 150–400)
RBC: 5.43 MIL/uL — ABNORMAL HIGH (ref 3.87–5.11)
RDW: 14.4 % (ref 11.5–15.5)
WBC: 5.4 10*3/uL (ref 4.0–10.5)

## 2014-10-24 LAB — LIPID PANEL
Cholesterol: 142 mg/dL (ref 125–200)
HDL: 59 mg/dL (ref >=46)
LDL CALC: 64 mg/dL (ref <130)
TRIGLYCERIDES: 97 mg/dL (ref <150)
Total CHOL/HDL Ratio: 2.4 Ratio (ref <=5.0)
VLDL: 19 mg/dL (ref <30)

## 2014-10-24 MED ORDER — ATORVASTATIN CALCIUM 20 MG PO TABS: ORAL_TABLET | ORAL | Status: DC

## 2014-10-24 NOTE — Progress Notes (Signed)
   Subjective:    Patient ID: Tamara Evans, female    DOB: 12/04/1945, 69 y.o.   MRN: [id number]  HPI She is here for an interval evaluation. She does complain of the right fourth finger getting stuck occasionally in the flexed position and having to manually open it. This is intermittent in nature. She also is noted a slight tremor in her chin that is intermittent in nature. She is not noticing any other tremor. She does notice that it does tend to get worse when she is under stress. She does have underlying breast cancer and is still getting therapy for this. She is also on Fosamax and has been on this for approximately 10 years. She does have thyroid nodules and continues on thyroid supplementation. She has had previous ultrasounds. The most recent one was this year and apparently was unchanged. She is now retired and Engineer, building services work. Her home life is going well. Family and social history as well as health maintenance and immunizations were reviewed Review of Systems  All other systems reviewed and are negative.      Objective:   Physical Exam Alert and in no distress. No tremor was noted of her chin. DTRs are normal. Skin is normal.Tympanic membranes and canals are normal. Pharyngeal area is normal. Neck is supple without adenopathy or thyromegaly. Cardiac exam shows a regular sinus rhythm without murmurs or gallops. Lungs are clear to auscultation.abdominal exam shows no masses or tenderness. Exam of the right hand shows full motion of all extremities.        Assessment & Plan:  Trigger finger, acquired  Need for prophylactic vaccination and inoculation against influenza - Plan: Flu vaccine HIGH DOSE PF (Fluzone High dose)  Breast cancer, right - Plan: CBC with Differential/Platelet, Comprehensive metabolic panel  Pure hypercholesterolemia - Plan: Lipid panel, atorvastatin (LIPITOR) 20 MG tablet  Osteopenia - Plan: CBC with Differential/Platelet, Comprehensive  metabolic panel  Tremor  Other specified hypothyroidism I will refer her to orthopedics when the finger gets her more trouble. She is comfortable with this. She will continue to be followed for her breast cancer. We will probably stop the Fosamax when she finishes with 5 years worth of treatment for her breast cancer. She is comfortable with that. Discussed treatment of the thyroid. She is welcome to have [place] follow-up on this or continue with her endocrinologist.discussed the tremor with her and since it is really only affecting the chin, watchful waiting is appropriate. Over 45 minutes, greater than 50% spent in counseling and coordination of care

## 2014-10-24 NOTE — Patient Instructions (Signed)
Let me know when you want your finger fixed and I will refer

## 2014-12-13 ENCOUNTER — Other Ambulatory Visit: Payer: Self-pay | Admitting: Family Medicine

## 2014-12-13 DIAGNOSIS — Z853 Personal history of malignant neoplasm of breast: Secondary | ICD-10-CM

## 2014-12-22 ENCOUNTER — Other Ambulatory Visit: Payer: Self-pay | Admitting: Family Medicine

## 2014-12-24 ENCOUNTER — Other Ambulatory Visit: Payer: Self-pay | Admitting: Family Medicine

## 2015-01-02 ENCOUNTER — Ambulatory Visit
Admission: RE | Admit: 2015-01-02 | Discharge: 2015-01-02 | Disposition: A | Discharge status: Home / Self Care | Payer: Medicare PPO | Source: Ambulatory Visit | Attending: Family Medicine | Admitting: Family Medicine

## 2015-01-02 DIAGNOSIS — Z853 Personal history of malignant neoplasm of breast: Secondary | ICD-10-CM

## 2015-06-13 ENCOUNTER — Ambulatory Visit: Payer: Medicare Other | Admitting: Hematology and Oncology

## 2015-06-14 ENCOUNTER — Encounter: Payer: Self-pay | Admitting: Hematology and Oncology

## 2015-06-14 ENCOUNTER — Ambulatory Visit (HOSPITAL_BASED_OUTPATIENT_CLINIC_OR_DEPARTMENT_OTHER): Payer: Medicare Other | Admitting: Hematology and Oncology

## 2015-06-14 ENCOUNTER — Other Ambulatory Visit: Payer: Self-pay | Admitting: Hematology and Oncology

## 2015-06-14 ENCOUNTER — Telehealth: Payer: Self-pay | Admitting: Hematology and Oncology

## 2015-06-14 VITALS — BP 121/64 | HR 83 | Temp 98.3°F | Resp 18 | Wt 154.3 lb

## 2015-06-14 DIAGNOSIS — Z853 Personal history of malignant neoplasm of breast: Secondary | ICD-10-CM

## 2015-06-14 DIAGNOSIS — M8580 Other specified disorders of bone density and structure, unspecified site: Secondary | ICD-10-CM | POA: Diagnosis not present

## 2015-06-14 DIAGNOSIS — M791 Myalgia: Secondary | ICD-10-CM

## 2015-06-14 DIAGNOSIS — N951 Menopausal and female climacteric states: Secondary | ICD-10-CM

## 2015-06-14 DIAGNOSIS — C50511 Malignant neoplasm of lower-outer quadrant of right female breast: Secondary | ICD-10-CM

## 2015-06-14 NOTE — Telephone Encounter (Signed)
Gave and printed appt sched and avs for pt for DEc...the patient sched for 12.6 at [facility] for 10am

## 2015-06-14 NOTE — Assessment & Plan Note (Signed)
Right breast cancer invasive ductal carcinoma grade 2, 0.8 cm, 2 SLN negative, yet 100%, PR 40%, Ki-67 64%, HER-2 negative ratio 1.07 status post lumpectomy followed by radiation, Oncotype DX recurrence score 23 intermediate risk, did not get chemotherapy, letrozole 2.5 mg daily started Dec 2012.  AI Toxicities: 1. Muscle aches and pains Patient will finish 5 years of letrozole by December 2017. I discussed the pros and cons of extended adjuvant therapy.I recommended sending breast cancer index to determine if she would be considered high risk as well as if she would benefit from extended adjuvant therapy.  Breast Cancer Surveillance: 1. Breast exam 06/13/14: Normal 2. Mammogram 12/31/13 No abnormalities. Postsurgical changes. Breast Density Category C. I recommended that she get 3-D mammograms for surveillance. Discussed the differences between different breast density categories.  Osteopenia: Was on Prolia. We discontinued it due to aches and pains. Plan to use oral bisphosphonates. Bone density 01/11/15:T score -1.9 on calcium and Vit D Patient will be set up to undergo another bone density in December 2017.  If her bone density has significantly improved and her breast cancer index reveals that she would need to send extended adjuvant therapy, she will then consider staying on the antiestrogen treatment for 5 more years. However if the bone density is not adequate at the breast cancer and that shows she does not need to continue treatment and she'll plans to stop after 2012 December.  i will see her back in December to make of the final plan.

## 2015-06-14 NOTE — Progress Notes (Signed)
Patient Care Team: Rita Ohara, MD as PCP - General (Family Medicine)  SUMMARY OF ONCOLOGIC HISTORY:   Breast cancer of lower-outer quadrant of right female breast ([place])   10/05/2010 Surgery Right breast lumpectomy 0.8 cm invasive ductal carcinoma grade 2. 0/2 SLN. ER +100% PR +40% Ki 67: 64% with HER-2/neu Neg ratio of 1.07; Oncotype 23   11/12/2010 - 12/25/2010 Radiation Therapy Adjuvant XRT   01/12/2011 -  Anti-estrogen oral therapy Letrozole 2.5 mg daily    CHIEF COMPLIANT: Annual follow-up on letrozole  INTERVAL HISTORY: Tamara Evans is a 70 year old with above-mentioned history of right breast cancer currently on adjuvant letrozole therapy and she appears to be tolerating it fairly well. She does have occasional hot flashes and occasional myalgias. She denies any lumps or nodules in the breasts.  REVIEW OF SYSTEMS:   Constitutional: Denies fevers, chills or abnormal weight loss Eyes: Denies blurriness of vision Ears, nose, mouth, throat, and face: Denies mucositis or sore throat Respiratory: Denies cough, dyspnea or wheezes Cardiovascular: Denies palpitation, chest discomfort Gastrointestinal:  Denies nausea, heartburn or change in bowel habits Skin: Denies abnormal skin rashes Lymphatics: Denies new lymphadenopathy or easy bruising Neurological:Denies numbness, tingling or new weaknesses Behavioral/Psych: Mood is stable, no new changes  Extremities: No lower extremity edema Breast:  denies any pain or lumps or nodules in either breasts All other systems were reviewed with the patient and are negative.  I have reviewed the past medical history, past surgical history, social history and family history with the patient and they are unchanged from previous note.  ALLERGIES:  is allergic to codeine.  MEDICATIONS:  Current Outpatient Prescriptions  Medication Sig Dispense Refill  . alendronate (FOSAMAX) 70 MG tablet TAKE ONE TABLET BY MOUTH WEEKLY 12 tablet 2  . aspirin 81  MG tablet Take 81 mg by mouth daily.      Marland Kitchen atorvastatin (LIPITOR) 20 MG tablet TAKE ONE TABLET BY MOUTH ONE TIME DAILY 90 tablet 2  . calcium carbonate (OS-CAL) 600 MG TABS Take 600 mg by mouth 2 (two) times daily with a meal.      . fish oil-omega-3 fatty acids 1000 MG capsule Take 1 g by mouth daily.      Marland Kitchen letrozole (FEMARA) 2.5 MG tablet TAKE ONE TABLET BY MOUTH ONE TIME DAILY 90 tablet 3  . Multiple Vitamin (MULTIVITAMIN) tablet Take 1 tablet by mouth daily.      Marland Kitchen SYNTHROID 100 MCG tablet      No current facility-administered medications for this visit.    PHYSICAL EXAMINATION: ECOG PERFORMANCE STATUS: 1 - Symptomatic but completely ambulatory  Filed Vitals:   06/14/15 1034  BP: 121/64  Pulse: 83  Temp: 98.3 F (36.8 C)  Resp: 18   Filed Weights   06/14/15 1034  Weight: 154 lb 4.8 oz (69.99 kg)    GENERAL:alert, no distress and comfortable SKIN: skin color, texture, turgor are normal, no rashes or significant lesions EYES: normal, Conjunctiva are pink and non-injected, sclera clear OROPHARYNX:no exudate, no erythema and lips, buccal mucosa, and tongue normal  NECK: supple, thyroid normal size, non-tender, without nodularity LYMPH:  no palpable lymphadenopathy in the cervical, axillary or inguinal LUNGS: clear to auscultation and percussion with normal breathing effort HEART: regular rate & rhythm and no murmurs and no lower extremity edema ABDOMEN:abdomen soft, non-tender and normal bowel sounds MUSCULOSKELETAL:no cyanosis of digits and no clubbing  NEURO: alert & oriented x 3 with fluent speech, no focal motor/sensory deficits EXTREMITIES: No lower  extremity edema  LABORATORY DATA:  I have reviewed the data as listed   Chemistry      Component Value Date/Time   NA 138 10/24/2014 0001   NA 146* 10/09/2012 0957   K 4.0 10/24/2014 0001   K 3.5 10/09/2012 0957   CL 102 10/24/2014 0001   CL 105 12/10/2011 0853   CO2 30 10/24/2014 0001   CO2 30* 10/09/2012 0957     BUN 24 10/24/2014 0001   BUN 27.2* 10/09/2012 0957   CREATININE 0.77 10/24/2014 0001   CREATININE 0.8 10/09/2012 0957   CREATININE 0.75 05/20/2011 0929      Component Value Date/Time   CALCIUM 9.5 10/24/2014 0001   CALCIUM 9.5 10/09/2012 0957   ALKPHOS 69 10/24/2014 0001   ALKPHOS 77 10/09/2012 0957   AST 22 10/24/2014 0001   AST 21 10/09/2012 0957   ALT 27 10/24/2014 0001   ALT 31 10/09/2012 0957   BILITOT 0.4 10/24/2014 0001   BILITOT 0.64 10/09/2012 0957       Lab Results  Component Value Date   WBC 5.4 10/24/2014   HGB 14.3 10/24/2014   HCT 43.0 10/24/2014   MCV 79.2 10/24/2014   PLT 203 10/24/2014   NEUTROABS 3.4 10/24/2014     ASSESSMENT & PLAN:  Breast cancer of lower-outer quadrant of right female breast Right breast cancer invasive ductal carcinoma grade 2, 0.8 cm, 2 SLN negative, yet 100%, PR 40%, Ki-67 64%, HER-2 negative ratio 1.07 status post lumpectomy followed by radiation, Oncotype DX recurrence score 23 intermediate risk, did not get chemotherapy, letrozole 2.5 mg daily started Dec 2012.  AI Toxicities: 1. Muscle aches and pains Patient will finish 5 years of letrozole by December 2017. I discussed the pros and cons of extended adjuvant therapy.I recommended sending breast cancer index to determine if she would be considered high risk as well as if she would benefit from extended adjuvant therapy.  Breast Cancer Surveillance: 1. Breast exam 06/13/14: Normal 2. Mammogram 12/31/13 No abnormalities. Postsurgical changes. Breast Density Category C. I recommended that she get 3-D mammograms for surveillance. Discussed the differences between different breast density categories.  Osteopenia: Was on Prolia. We discontinued it due to aches and pains. Plan to use oral bisphosphonates. Bone density 01/11/15:T score -1.9 on calcium and Vit D Patient will be set up to undergo another bone density in December 2017.  If her bone density has significantly improved  and her breast cancer index reveals that she would need to send extended adjuvant therapy, she will then consider staying on the antiestrogen treatment for 5 more years. However if the bone density is not adequate at the breast cancer and that shows she does not need to continue treatment and she'll plans to stop after 2012 December.  i will see her back in December to make of the final plan.    Orders Placed This Encounter  Procedures  . DG Bone Density    Standing Status: Future     Number of Occurrences:      Standing Expiration Date: 06/13/2016    Order Specific Question:  Reason for Exam (SYMPTOM  OR DIAGNOSIS REQUIRED)    Answer:  OSteopenia evaluation on anti estrogen therapy    Order Specific Question:  Preferred imaging location?    Answer:  External     Comments:  [facility]   The patient has a good understanding of the overall plan. she agrees with it. she will call with any problems that may develop  before the next visit here.   Rulon Eisenmenger, MD 06/14/2015

## 2015-06-28 ENCOUNTER — Encounter: Payer: Self-pay | Admitting: *Deleted

## 2015-06-28 NOTE — Progress Notes (Signed)
Ordered BCI per Dr. Gudena.  Faxed requisition to [facility] and informed pathology. 

## 2015-07-07 ENCOUNTER — Encounter (HOSPITAL_COMMUNITY): Payer: Self-pay

## 2015-07-10 ENCOUNTER — Telehealth: Payer: Self-pay | Admitting: Hematology and Oncology

## 2015-07-10 NOTE — Telephone Encounter (Signed)
Call patient with the result of breast cancer index test showing low likelihood of benefit from extended adjuvant therapy. I instructed the patient that she can stop letrozole therapy in December 2017. I will discuss the complete report when she comes back to see me. She did have a high risk of recurrence of 5.7% between years 5-10 however she has low likelihood of benefit from extended adjuvant therapy.Marland Kitchen

## 2015-08-17 ENCOUNTER — Ambulatory Visit
Admission: RE | Admit: 2015-08-17 | Discharge: 2015-08-17 | Disposition: A | Discharge status: Home / Self Care | Payer: Medicare Other | Source: Ambulatory Visit | Attending: Endocrinology | Admitting: Endocrinology

## 2015-08-17 DIAGNOSIS — E049 Nontoxic goiter, unspecified: Secondary | ICD-10-CM

## 2015-08-18 ENCOUNTER — Other Ambulatory Visit: Payer: Self-pay | Admitting: Hematology and Oncology

## 2015-09-19 ENCOUNTER — Other Ambulatory Visit: Payer: Self-pay | Admitting: Family Medicine

## 2015-09-19 NOTE — Telephone Encounter (Signed)
While I am listed as the PCP, it doesn't look like I've actually seen her (in many years).  It looks like Dr. Redmond School filled her last alendronate in the system, but it really should be coming from Dr. Lindi Adie.  This is her oncologist, whom she does see regularly, last seen in May, and who is arranging for her next bone density scan. That is who should be filling her alendronate

## 2015-09-19 NOTE — Telephone Encounter (Signed)
Is this okay to refill? 

## 2015-09-25 ENCOUNTER — Telehealth: Payer: Self-pay | Admitting: Family Medicine

## 2015-09-25 ENCOUNTER — Other Ambulatory Visit: Payer: Self-pay | Admitting: Family Medicine

## 2015-09-25 NOTE — Telephone Encounter (Signed)
Pt called about refill.  Called pharmacy & they did receive the request & are filling.  I called pt back and informed.

## 2015-11-02 ENCOUNTER — Ambulatory Visit (INDEPENDENT_AMBULATORY_CARE_PROVIDER_SITE_OTHER): Payer: Medicare Other | Admitting: Family Medicine

## 2015-11-02 VITALS — BP 112/70 | HR 72 | Ht 63.5 in | Wt 151.0 lb

## 2015-11-02 DIAGNOSIS — M8580 Other specified disorders of bone density and structure, unspecified site: Secondary | ICD-10-CM

## 2015-11-02 DIAGNOSIS — E038 Other specified hypothyroidism: Secondary | ICD-10-CM

## 2015-11-02 DIAGNOSIS — E7800 Pure hypercholesterolemia, unspecified: Secondary | ICD-10-CM

## 2015-11-02 DIAGNOSIS — Z1159 Encounter for screening for other viral diseases: Secondary | ICD-10-CM

## 2015-11-02 DIAGNOSIS — C50511 Malignant neoplasm of lower-outer quadrant of right female breast: Secondary | ICD-10-CM

## 2015-11-02 LAB — CBC WITH DIFFERENTIAL/PLATELET
BASOS PCT: 0 %
Basophils Absolute: 0 cells/uL (ref 0–200)
EOS PCT: 2 %
Eosinophils Absolute: 138 cells/uL (ref 15–500)
HCT: 40.5 % (ref 35.0–45.0)
Hemoglobin: 13.5 g/dL (ref 11.7–15.5)
LYMPHS ABS: 1518 {cells}/uL (ref 850–3900)
LYMPHS PCT: 22 %
MCH: 25.3 pg — ABNORMAL LOW (ref 27.0–33.0)
MCHC: 33.3 g/dL (ref 32.0–36.0)
MCV: 75.8 fL — ABNORMAL LOW (ref 80.0–100.0)
MONO ABS: 690 {cells}/uL (ref 200–950)
MPV: 10.1 fL (ref 7.5–12.5)
Monocytes Relative: 10 %
NEUTROS PCT: 66 %
Neutro Abs: 4554 cells/uL (ref 1500–7800)
Platelets: 223 10*3/uL (ref 140–400)
RBC: 5.34 MIL/uL — AB (ref 3.80–5.10)
RDW: 15.3 % — AB (ref 11.0–15.0)
WBC: 6.9 10*3/uL (ref 4.0–10.5)

## 2015-11-02 LAB — LIPID PANEL
CHOL/HDL RATIO: 2.2 ratio (ref <=5.0)
Cholesterol: 141 mg/dL (ref 125–200)
HDL: 64 mg/dL (ref >=46)
LDL Cholesterol: 54 mg/dL (ref <130)
Triglycerides: 114 mg/dL (ref <150)
VLDL: 23 mg/dL (ref <30)

## 2015-11-02 LAB — COMPREHENSIVE METABOLIC PANEL
ALK PHOS: 71 U/L (ref 33–130)
ALT: 26 U/L (ref 6–29)
AST: 24 U/L (ref 10–35)
Albumin: 4 g/dL (ref 3.6–5.1)
BILIRUBIN TOTAL: 0.4 mg/dL (ref 0.2–1.2)
BUN: 27 mg/dL — AB (ref 7–25)
CO2: 28 mmol/L (ref 20–31)
CREATININE: 0.88 mg/dL (ref 0.50–0.99)
Calcium: 9.3 mg/dL (ref 8.6–10.4)
Chloride: 106 mmol/L (ref 98–110)
GLUCOSE: 99 mg/dL (ref 65–99)
Potassium: 4.1 mmol/L (ref 3.5–5.3)
SODIUM: 143 mmol/L (ref 135–146)
Total Protein: 6.6 g/dL (ref 6.1–8.1)

## 2015-11-02 LAB — TSH: TSH: 0.06 m[IU]/L — AB

## 2015-11-02 NOTE — Progress Notes (Signed)
Subjective:   HPI  Tamara Evans is a 70 y.o. female who presents for a complete physical.  Medical care team includes:  [facility].  [facility] onc.   Preventative care: Last ophthalmology visit:6 months ago Last dental visit:10/10/15 Last colonoscopy:01/13/14 Last mammogram:01/02/15 Last gynecological exam:? Last EKG:10/04/10 Last labs:10/24/14  Prior vaccinations: TD or Tdap:02/11/11 Influenza:10/30/15 Pneumococcal:23:09/03/07 13: 10/22/13 Shingles/Zostavax:09/08/07  Concerns:She continues on Fosamax and will be discussing whether to continue that with her oncologist. She has been on this for approximately 5 years. She continues on letrozole as well. She takes Lipitor for her cholesterol and has had no difficulty with that. She is on unremarkable gram Synthroid and does usually see Dr. Wilson Singer for this. She has no other concerns or complaints. She is retired and seems to be enjoying her retirement.   Reviewed their medical, surgical, family, social, medication, and allergy history and updated chart as appropriate.    Review of Systems Constitutional: -fever, -chills, -sweats, -unexpected weight change, -decreased appetite, -fatigue Allergy: -sneezing, -itching, -congestion Dermatology: -changing moles, --rash, -lumps Cardiology: -chest pain, -palpitations, -swelling, -difficulty breathing when lying flat, -waking up short of breath Respiratory: -cough, -shortness of breath, -difficulty breathing with exercise or exertion, -wheezing, -coughing up blood Gastroenterology: -abdominal pain, -nausea, -vomiting, -diarrhea, -constipation, -blood in stool, -changes in bowel movement, -difficulty swallowing or eating  Musculoskeletal: -joint aches, -muscle aches, -joint swelling, -back pain, -neck pain, -cramping, -changes in gait Ophthalmology: denies vision changes, eye redness, itching, discharge  Neurology: -headache, -weakness, -tingling, -numbness, -memory loss, -falls,  -dizziness Psychology: -depressed mood, -agitation, -sleep problems     Objective:   Physical Exam   General appearance: alert, no distress, WD/WN,  Skin: Normal. HEENT: normocephalic, conjunctiva/corneas normal, sclerae anicteric, PERRLA, EOMi, nares patent, no discharge or erythema, pharynx normal Oral cavity: MMM, tongue normal, teeth normal Neck: supple, no lymphadenopathy, no thyromegaly, no masses, normal ROM  Heart: RRR, normal S1, S2, no murmurs Lungs: CTA bilaterally, no wheezes, rhonchi, or rales Abdomen: +bs, soft, non tender, non distended, no masses, no hepatomegaly, no splenomegaly, no bruits  Neurological: alert, oriented x 3, CN2-12 intact, strength normal upper extremities and lower extremities, sensation normal throughout, DTRs 2+ throughout, no cerebellar signs, gait normal Psychiatric: normal affect, behavior normal, pleasant  :     Assessment and Plan :  Malignant neoplasm of lower-outer quadrant of right female breast, unspecified estrogen receptor status ([place])  Pure hypercholesterolemia - Plan: Lipid panel  Osteopenia, unspecified location - Plan: CBC with Differential/Platelet, Comprehensive metabolic panel, Lipid panel  Need for hepatitis C screening test - Plan: Hepatitis C antibody  Other specified hypothyroidism - Plan: TSH   Physical exam - discussed healthy lifestyle, diet, exercise, preventative care, vaccinations, and addressed their concerns.  Follow-up yearly.

## 2015-11-03 LAB — HEPATITIS C ANTIBODY: HCV Ab: NEGATIVE

## 2015-11-30 ENCOUNTER — Other Ambulatory Visit: Payer: Self-pay | Admitting: Endocrinology

## 2015-11-30 DIAGNOSIS — E041 Nontoxic single thyroid nodule: Secondary | ICD-10-CM

## 2015-12-10 ENCOUNTER — Encounter: Payer: Self-pay | Admitting: Family Medicine

## 2015-12-10 ENCOUNTER — Other Ambulatory Visit: Payer: Self-pay | Admitting: Family Medicine

## 2015-12-11 ENCOUNTER — Other Ambulatory Visit: Payer: Self-pay

## 2015-12-11 MED ORDER — ATORVASTATIN CALCIUM 20 MG PO TABS: 20.0000 mg | ORAL_TABLET | Freq: Every day | ORAL | 2 refills | Status: DC

## 2015-12-11 MED ORDER — ALENDRONATE SODIUM 70 MG PO TABS: 70.0000 mg | ORAL_TABLET | ORAL | 0 refills | Status: DC

## 2015-12-14 ENCOUNTER — Other Ambulatory Visit: Payer: Self-pay | Admitting: Family Medicine

## 2015-12-14 DIAGNOSIS — E7800 Pure hypercholesterolemia, unspecified: Secondary | ICD-10-CM

## 2016-01-03 LAB — HM DEXA SCAN

## 2016-01-04 ENCOUNTER — Ambulatory Visit
Admission: RE | Admit: 2016-01-04 | Discharge: 2016-01-04 | Disposition: A | Discharge status: Home / Self Care | Payer: Medicare Other | Source: Ambulatory Visit | Attending: Hematology and Oncology | Admitting: Hematology and Oncology

## 2016-01-04 DIAGNOSIS — Z853 Personal history of malignant neoplasm of breast: Secondary | ICD-10-CM

## 2016-01-09 NOTE — Assessment & Plan Note (Signed)
Right breast cancer invasive ductal carcinoma grade 2, 0.8 cm, 2 SLN negative, yet 100%, PR 40%, Ki-67 64%, HER-2 negative ratio 1.07 status post lumpectomy followed by radiation, Oncotype DX recurrence score 23 intermediate risk, did not get chemotherapy, letrozole 2.5 mg daily started Dec 2012.  AI Toxicities: 1. Muscle aches and pains Patient will finish 5 years of letrozole by December 2017. I discussed the pros and cons of extended adjuvant therapy.I recommended sending breast cancer index to determine if she would be considered high risk as well as if she would benefit from extended adjuvant therapy.  Breast Cancer Surveillance: 1. Breast exam 06/13/14: Normal 2. Mammogram 12/31/13 No abnormalities. Postsurgical changes. Breast Density Category C. I recommended that she get 3-D mammograms for surveillance. Discussed the differences between different breast density categories.  Osteopenia: Was on Prolia. We discontinued it due to aches and pains. Plan to use oral bisphosphonates. Bone density 01/11/15:T score -1.9 on calcium and Vit D Patient will be set up to undergo another bone density in December 2017.  Depending on the bone density and BCI, we may consider extended adj therapy.

## 2016-01-10 ENCOUNTER — Encounter: Payer: Self-pay | Admitting: Hematology and Oncology

## 2016-01-10 ENCOUNTER — Encounter: Payer: Self-pay | Admitting: Family Medicine

## 2016-01-10 ENCOUNTER — Ambulatory Visit (HOSPITAL_BASED_OUTPATIENT_CLINIC_OR_DEPARTMENT_OTHER): Payer: Medicare Other | Admitting: Hematology and Oncology

## 2016-01-10 DIAGNOSIS — Z853 Personal history of malignant neoplasm of breast: Secondary | ICD-10-CM

## 2016-01-10 DIAGNOSIS — M8580 Other specified disorders of bone density and structure, unspecified site: Secondary | ICD-10-CM

## 2016-01-10 DIAGNOSIS — Z170 Estrogen receptor positive status [ER+]: Principal | ICD-10-CM

## 2016-01-10 DIAGNOSIS — C50511 Malignant neoplasm of lower-outer quadrant of right female breast: Secondary | ICD-10-CM

## 2016-01-10 NOTE — Progress Notes (Signed)
Patient Care Team: Denita Lung, MD as PCP - General (Family Medicine)  DIAGNOSIS:  Encounter Diagnosis  Name Primary?  . Malignant neoplasm of lower-outer quadrant of right breast of female, estrogen receptor positive ([place])     SUMMARY OF ONCOLOGIC HISTORY:   Breast cancer of lower-outer quadrant of right female breast ([place])   10/05/2010 Surgery    Right breast lumpectomy 0.8 cm invasive ductal carcinoma grade 2. 0/2 SLN. ER +100% PR +40% Ki 67: 64% with HER-2/neu Neg ratio of 1.07; Oncotype 23      11/12/2010 - 12/25/2010 Radiation Therapy    Adjuvant XRT      01/12/2011 - 01/10/2016 Anti-estrogen oral therapy    Letrozole 2.5 mg daily completed 5 years. Breast cancer index revealed low likelihood of benefit from extended adjuvant therapy. Risk of recurrence 5.7% yr 5-10       CHIEF COMPLIANT: Annual follow-up on letrozole  INTERVAL HISTORY: DANNIELA MCBREARTY is a 70 year old with above-mentioned history of right breast cancer treated with lumpectomy adjuvant radiation. She is currently on letrozole since past 5 years. Overall she is tolerating it moderately well. She had muscle aches and pains as well as osteopenia. She had a recent mammogram which was normal. She denies any lumps or nodules in the breast.  REVIEW OF SYSTEMS:   Constitutional: Denies fevers, chills or abnormal weight loss Eyes: Denies blurriness of vision Ears, nose, mouth, throat, and face: Denies mucositis or sore throat Respiratory: Denies cough, dyspnea or wheezes Cardiovascular: Denies palpitation, chest discomfort Gastrointestinal:  Denies nausea, heartburn or change in bowel habits Skin: Denies abnormal skin rashes Lymphatics: Denies new lymphadenopathy or easy bruising Neurological:Denies numbness, tingling or new weaknesses Behavioral/Psych: Mood is stable, no new changes  Extremities: No lower extremity edema Breast:  denies any pain or lumps or nodules in either breasts All other systems  were reviewed with the patient and are negative.  I have reviewed the past medical history, past surgical history, social history and family history with the patient and they are unchanged from previous note.  ALLERGIES:  is allergic to codeine.  MEDICATIONS:  Current Outpatient Prescriptions  Medication Sig Dispense Refill  . aspirin 81 MG tablet Take 81 mg by mouth daily.      Marland Kitchen atorvastatin (LIPITOR) 20 MG tablet Take 1 tablet (20 mg total) by mouth daily. 90 tablet 2  . atorvastatin (LIPITOR) 20 MG tablet TAKE ONE TABLET BY MOUTH ONE TIME DAILY 90 tablet 0  . calcium carbonate (OS-CAL) 600 MG TABS Take 600 mg by mouth 2 (two) times daily with a meal.      . fish oil-omega-3 fatty acids 1000 MG capsule Take 1 g by mouth daily.      . Multiple Vitamin (MULTIVITAMIN) tablet Take 1 tablet by mouth daily.      Marland Kitchen SYNTHROID 100 MCG tablet      No current facility-administered medications for this visit.     PHYSICAL EXAMINATION: ECOG PERFORMANCE STATUS: 1 - Symptomatic but completely ambulatory  Vitals:   01/10/16 1350  BP: (!) 149/75  Pulse: 89  Resp: 18  Temp: 98 F (36.7 C)   Filed Weights   01/10/16 1350  Weight: 152 lb (68.9 kg)    GENERAL:alert, no distress and comfortable SKIN: skin color, texture, turgor are normal, no rashes or significant lesions EYES: normal, Conjunctiva are pink and non-injected, sclera clear OROPHARYNX:no exudate, no erythema and lips, buccal mucosa, and tongue normal  NECK: supple, thyroid normal size, non-tender,  without nodularity LYMPH:  no palpable lymphadenopathy in the cervical, axillary or inguinal LUNGS: clear to auscultation and percussion with normal breathing effort HEART: regular rate & rhythm and no murmurs and no lower extremity edema ABDOMEN:abdomen soft, non-tender and normal bowel sounds MUSCULOSKELETAL:no cyanosis of digits and no clubbing  NEURO: alert & oriented x 3 with fluent speech, no focal motor/sensory  deficits EXTREMITIES: No lower extremity edema BREAST: No palpable masses or nodules in either right or left breasts. No palpable axillary supraclavicular or infraclavicular adenopathy no breast tenderness or nipple discharge. (exam performed in the presence of a chaperone)  LABORATORY DATA:  I have reviewed the data as listed   Chemistry      Component Value Date/Time   NA 143 11/02/2015 1424   NA 146 (H) 10/09/2012 0957   K 4.1 11/02/2015 1424   K 3.5 10/09/2012 0957   CL 106 11/02/2015 1424   CL 105 12/10/2011 0853   CO2 28 11/02/2015 1424   CO2 30 (H) 10/09/2012 0957   BUN 27 (H) 11/02/2015 1424   BUN 27.2 (H) 10/09/2012 0957   CREATININE 0.88 11/02/2015 1424   CREATININE 0.8 10/09/2012 0957      Component Value Date/Time   CALCIUM 9.3 11/02/2015 1424   CALCIUM 9.5 10/09/2012 0957   ALKPHOS 71 11/02/2015 1424   ALKPHOS 77 10/09/2012 0957   AST 24 11/02/2015 1424   AST 21 10/09/2012 0957   ALT 26 11/02/2015 1424   ALT 31 10/09/2012 0957   BILITOT 0.4 11/02/2015 1424   BILITOT 0.64 10/09/2012 0957       Lab Results  Component Value Date   WBC 6.9 11/02/2015   HGB 13.5 11/02/2015   HCT 40.5 11/02/2015   MCV 75.8 (L) 11/02/2015   PLT 223 11/02/2015   NEUTROABS 4,554 11/02/2015    ASSESSMENT & PLAN:  Breast cancer of lower-outer quadrant of right female breast Right breast cancer invasive ductal carcinoma grade 2, 0.8 cm, 2 SLN negative, yet 100%, PR 40%, Ki-67 64%, HER-2 negative ratio 1.07 status post lumpectomy followed by radiation, Oncotype DX recurrence score 23 intermediate risk, did not get chemotherapy, letrozole 2.5 mg daily started Dec 2012.  AI Toxicities: 1. Muscle aches and pains Patient finish 5 years of antiestrogen therapy. I reviewed the results of the breast cancer index test. Patient has a 5.7% risk of recurrence between year 5-10. BCI also revealed that she does not get benefit from extended adjuvant therapy. Her bone density test revealed  a T score of -2 Discussed the pros and cons of extended adjuvant therapy. There is a 15% risk of osteoporotic bone fracture.  Decision: Discontinue letrozole  Breast Cancer Surveillance: 1. Breast exam 06/13/14: Normal 2. Mammogram 01/04/16 No abnormalities. Postsurgical changes. Breast Density Category B. I recommended that she get 3-D mammograms for surveillance. Discussed the differences between different breast density categories.  Osteopenia: Was on Prolia. Bone density December 2017 revealed a T score of -2 Return to clinic in one year with follow-up with survivorship clinic  Orders Placed This Encounter  Procedures  . Amb Referral to Survivorship Long term    Referral Priority:   Routine    Referral Type:   Consultation    Number of Visits Requested:   1   The patient has a good understanding of the overall plan. she agrees with it. she will call with any problems that may develop before the next visit here.   Rulon Eisenmenger, MD 01/10/16

## 2016-01-11 NOTE — Progress Notes (Signed)
Received bone density results from [facility]. Sent to scan.

## 2016-02-16 ENCOUNTER — Encounter: Payer: Self-pay | Admitting: Family Medicine

## 2016-02-16 ENCOUNTER — Ambulatory Visit (INDEPENDENT_AMBULATORY_CARE_PROVIDER_SITE_OTHER): Payer: Medicare Other | Admitting: Family Medicine

## 2016-02-16 VITALS — BP 130/70 | HR 80 | Temp 98.4°F | Resp 16 | Wt 151.6 lb

## 2016-02-16 DIAGNOSIS — N3001 Acute cystitis with hematuria: Secondary | ICD-10-CM

## 2016-02-16 DIAGNOSIS — R300 Dysuria: Secondary | ICD-10-CM | POA: Diagnosis not present

## 2016-02-16 LAB — POCT URINALYSIS DIPSTICK
Bilirubin, UA: NEGATIVE
Glucose, UA: NEGATIVE
Ketones, UA: NEGATIVE
Leukocytes, UA: NEGATIVE
NITRITE UA: NEGATIVE
PH UA: 7
Protein, UA: NEGATIVE
Spec Grav, UA: 1.015
UROBILINOGEN UA: NEGATIVE

## 2016-02-16 MED ORDER — NITROFURANTOIN MONOHYD MACRO 100 MG PO CAPS: 100.0000 mg | ORAL_CAPSULE | Freq: Two times a day (BID) | ORAL | 0 refills | Status: DC

## 2016-02-16 NOTE — Progress Notes (Signed)
Subjective:  Tamara Evans is a 71 y.o. female who complains of possible urinary tract infection.  She has had symptoms for 5 days.  Symptoms include dysuria, urinary frequency and suprapubic pressure.  Patient denies back pain, fever and vaginal discharge, N/V/D.  Last UTI was several years ago.   Using nothing for current symptoms.    Patient does not have a history of recurrent UTI. Patient does not have a history of pyelonephritis.  No other aggravating or relieving factors.  No other c/o.  She also reports a 5 day history of URI symptoms including rhinorrhea, nasal congestion and cough. Denies chest pain, palpitations, shortness of breath, wheezing.   Past Medical History:  Diagnosis Date  . Allergy    perennial  . Breast cancer ([place]) 2012   right breast stage I, er/pr positive, invasive ductal CA. Treated with lumpectomy and radiation  . Fibrocystic breast disease   . Goiter, nodular 05/06   managed by Dr. Wilson Singer  . Hx of radiation therapy    right breast  . Hyperlipidemia   . Menopause   . Multinodular goiter   . Osteopenia 6/07   L-spine  . PMS (premenstrual syndrome)     ROS as in subjective  Reviewed allergies, medications, past medical, surgical, and social history.    Objective: Vitals:   02/16/16 1450  BP: 130/70  Pulse: 80  Resp: 16  Temp: 98.4 F (36.9 C)    General appearance: alert, no distress, WD/WN, female  Lungs: clear throughout, no wheezes, rales, or rhonchi Heart: RRR Abdomen: +bs, soft, non tender, non distended, no masses, no hepatomegaly, no splenomegaly, no bruits Back: no CVA tenderness GU: declined     Laboratory:  Urine dipstick: 3+ for hemoglobin.       Assessment: Dysuria - Plan: Urinalysis Dipstick  Acute cystitis with hematuria - Plan: nitrofurantoin, macrocrystal-monohydrate, (MACROBID) 100 MG capsule, POCT urinalysis dipstick, Urine culture    Plan: Discussed symptoms, diagnosis, possible complications, and usual  course of illness.  Begin medication Macrobid. Advised increased water intake, can use OTC Tylenol for pain.    Advised she may try AZO for the next 1-2 days and then stop.    Urine culture sent.     Call if worse or not improving.  Return for a repeat UA dipstick in 2 weeks, lab visit. Order is in the chart.   Discussed symptomatic treatment of acute URI symptoms.

## 2016-02-16 NOTE — Patient Instructions (Signed)
Drink extra water. You can take AZO for the next 1-2 days if needed for pain. Take the antibiotic as prescribed.  Follow up in 2 weeks for a lab visit for a repeat urine test.    Urinary Tract Infection, Adult A urinary tract infection (UTI) is an infection of any part of the urinary tract, which includes the kidneys, ureters, bladder, and urethra. These organs make, store, and get rid of urine in the body. UTI can be a bladder infection (cystitis) or kidney infection (pyelonephritis). What are the causes? This infection may be caused by fungi, viruses, or bacteria. Bacteria are the most common cause of UTIs. This condition can also be caused by repeated incomplete emptying of the bladder during urination. What increases the risk? This condition is more likely to develop if:  You ignore your need to urinate or hold urine for long periods of time.  You do not empty your bladder completely during urination.  You wipe back to front after urinating or having a bowel movement, if you are female.  You are uncircumcised, if you are female.  You are constipated.  You have a urinary catheter that stays in place (indwelling).  You have a weak defense (immune) system.  You have a medical condition that affects your bowels, kidneys, or bladder.  You have diabetes.  You take antibiotic medicines frequently or for long periods of time, and the antibiotics no longer work well against certain types of infections (antibiotic resistance).  You take medicines that irritate your urinary tract.  You are exposed to chemicals that irritate your urinary tract.  You are female. What are the signs or symptoms? Symptoms of this condition include:  Fever.  Frequent urination or passing small amounts of urine frequently.  Needing to urinate urgently.  Pain or burning with urination.  Urine that smells bad or unusual.  Cloudy urine.  Pain in the lower abdomen or back.  Trouble  urinating.  Blood in the urine.  Vomiting or being less hungry than normal.  Diarrhea or abdominal pain.  Vaginal discharge, if you are female. How is this diagnosed? This condition is diagnosed with a medical history and physical exam. You will also need to provide a urine sample to test your urine. Other tests may be done, including:  Blood tests.  Sexually transmitted disease (STD) testing. If you have had more than one UTI, a cystoscopy or imaging studies may be done to determine the cause of the infections. How is this treated? Treatment for this condition often includes a combination of two or more of the following:  Antibiotic medicine.  Other medicines to treat less common causes of UTI.  Over-the-counter medicines to treat pain.  Drinking enough water to stay hydrated. Follow these instructions at home:  Take over-the-counter and prescription medicines only as told by your health care provider.  If you were prescribed an antibiotic, take it as told by your health care provider. Do not stop taking the antibiotic even if you start to feel better.  Avoid alcohol, caffeine, tea, and carbonated beverages. They can irritate your bladder.  Drink enough fluid to keep your urine clear or pale yellow.  Keep all follow-up visits as told by your health care provider. This is important.  Make sure to:  Empty your bladder often and completely. Do not hold urine for long periods of time.  Empty your bladder before and after sex.  Wipe from front to back after a bowel movement if you are female.  Use each tissue one time when you wipe. Contact a health care provider if:  You have back pain.  You have a fever.  You feel nauseous or vomit.  Your symptoms do not get better after 3 days.  Your symptoms go away and then return. Get help right away if:  You have severe back pain or lower abdominal pain.  You are vomiting and cannot keep down any medicines or water. This  information is not intended to replace advice given to you by your health care provider. Make sure you discuss any questions you have with your health care provider. Document Released: 10/24/2004 Document Revised: 06/28/2015 Document Reviewed: 12/05/2014 Elsevier Interactive Patient Education  2017 [employer].

## 2016-02-18 LAB — URINE CULTURE

## 2016-02-26 ENCOUNTER — Telehealth: Payer: Self-pay

## 2016-02-26 MED ORDER — CIPROFLOXACIN HCL 250 MG PO TABS: 250.0000 mg | ORAL_TABLET | Freq: Two times a day (BID) | ORAL | 0 refills | Status: DC

## 2016-02-26 NOTE — Telephone Encounter (Signed)
Pt called the office to let us know that ABX is not helping UTI sx. She is requesting different ABX. Victorino December

## 2016-02-26 NOTE — Telephone Encounter (Signed)
Please call in cipro 250 mg bid x 3 days and if she does not notice significant improvement she will need to come back in for further evaluation and another urine collection.

## 2016-02-26 NOTE — Telephone Encounter (Signed)
Pt was notified. Med sent in

## 2016-03-01 ENCOUNTER — Other Ambulatory Visit (INDEPENDENT_AMBULATORY_CARE_PROVIDER_SITE_OTHER): Payer: Medicare Other

## 2016-03-01 DIAGNOSIS — N3001 Acute cystitis with hematuria: Secondary | ICD-10-CM | POA: Diagnosis not present

## 2016-03-01 LAB — POCT URINALYSIS DIPSTICK
Bilirubin, UA: NEGATIVE
Ketones, UA: NEGATIVE
Leukocytes, UA: NEGATIVE
Nitrite, UA: NEGATIVE
Protein, UA: NEGATIVE
RBC UA: NEGATIVE
Urobilinogen, UA: NEGATIVE
pH, UA: 6

## 2016-04-25 ENCOUNTER — Encounter: Payer: Self-pay | Admitting: Family Medicine

## 2016-07-19 ENCOUNTER — Telehealth: Payer: Self-pay | Admitting: Family Medicine

## 2016-07-19 NOTE — Telephone Encounter (Signed)
Pt informed and verbalized understanding

## 2016-07-19 NOTE — Telephone Encounter (Signed)
Pt come by and wants to know if you will take over seeing her for her thyroid issues her Endocrinologist dr is retiring, she has a thyroid ultrasound scheduled with [facility]  For July 18-18 but the results are suppose to go the [facility] but she wants to know if you are ok with the results going to you instead,     Also her 5 years with   Oncology is ending she wants to know if you are ok with taking over seeing her for that she is going get set up for a mammogram,   Also she set up for a medicare well visit in October but wants to come 10-30-2016 for labs if you could put them in for her   Pt can be reached at [phone] with any questions  Sent for all records from [facility]  Put note in your folder about her thyroid ultrsound

## 2016-07-19 NOTE — Telephone Encounter (Signed)
ok 

## 2016-07-25 ENCOUNTER — Other Ambulatory Visit: Payer: Self-pay | Admitting: Nurse Practitioner

## 2016-07-30 ENCOUNTER — Telehealth: Payer: Self-pay

## 2016-07-30 NOTE — Telephone Encounter (Signed)
Records from [facility] placed in your folder for review. Tamara Evans

## 2016-08-14 ENCOUNTER — Ambulatory Visit
Admission: RE | Admit: 2016-08-14 | Discharge: 2016-08-14 | Disposition: A | Discharge status: Home / Self Care | Payer: Medicare Other | Source: Ambulatory Visit | Attending: Endocrinology | Admitting: Endocrinology

## 2016-08-14 DIAGNOSIS — E041 Nontoxic single thyroid nodule: Secondary | ICD-10-CM

## 2016-08-16 ENCOUNTER — Encounter: Payer: Self-pay | Admitting: Family Medicine

## 2016-09-09 ENCOUNTER — Other Ambulatory Visit: Payer: Self-pay | Admitting: Family Medicine

## 2016-09-28 ENCOUNTER — Other Ambulatory Visit: Payer: Self-pay | Admitting: Nurse Practitioner

## 2016-10-29 ENCOUNTER — Telehealth: Payer: Self-pay | Admitting: Internal Medicine

## 2016-10-29 DIAGNOSIS — E7800 Pure hypercholesterolemia, unspecified: Secondary | ICD-10-CM

## 2016-10-29 DIAGNOSIS — M8580 Other specified disorders of bone density and structure, unspecified site: Secondary | ICD-10-CM

## 2016-10-29 DIAGNOSIS — E038 Other specified hypothyroidism: Secondary | ICD-10-CM

## 2016-10-29 NOTE — Telephone Encounter (Signed)
Pt is coming in tomorrow for labs. Please put in future orders in

## 2016-10-30 ENCOUNTER — Other Ambulatory Visit: Payer: Medicare Other

## 2016-10-30 DIAGNOSIS — M8580 Other specified disorders of bone density and structure, unspecified site: Secondary | ICD-10-CM

## 2016-10-30 DIAGNOSIS — E7800 Pure hypercholesterolemia, unspecified: Secondary | ICD-10-CM

## 2016-10-30 DIAGNOSIS — E038 Other specified hypothyroidism: Secondary | ICD-10-CM

## 2016-10-30 LAB — CBC WITH DIFFERENTIAL/PLATELET
BASOS PCT: 0.8 %
Basophils Absolute: 42 cells/uL (ref 0–200)
EOS PCT: 3.1 %
Eosinophils Absolute: 161 cells/uL (ref 15–500)
HEMATOCRIT: 43.7 % (ref 35.0–45.0)
HEMOGLOBIN: 14.3 g/dL (ref 11.7–15.5)
LYMPHS ABS: 1492 {cells}/uL (ref 850–3900)
MCH: 25.8 pg — ABNORMAL LOW (ref 27.0–33.0)
MCHC: 32.7 g/dL (ref 32.0–36.0)
MCV: 78.7 fL — ABNORMAL LOW (ref 80.0–100.0)
MPV: 10.4 fL (ref 7.5–12.5)
Monocytes Relative: 9.6 %
NEUTROS ABS: 3006 {cells}/uL (ref 1500–7800)
Neutrophils Relative %: 57.8 %
Platelets: 213 10*3/uL (ref 140–400)
RBC: 5.55 10*6/uL — AB (ref 3.80–5.10)
RDW: 13.5 % (ref 11.0–15.0)
Total Lymphocyte: 28.7 %
WBC mixed population: 499 cells/uL (ref 200–950)
WBC: 5.2 10*3/uL (ref 3.8–10.8)

## 2016-10-30 LAB — COMPREHENSIVE METABOLIC PANEL
AG Ratio: 1.6 (calc) (ref 1.0–2.5)
ALBUMIN MSPROF: 4 g/dL (ref 3.6–5.1)
ALT: 26 U/L (ref 6–29)
AST: 21 U/L (ref 10–35)
Alkaline phosphatase (APISO): 91 U/L (ref 33–130)
BILIRUBIN TOTAL: 0.5 mg/dL (ref 0.2–1.2)
BUN: 23 mg/dL (ref 7–25)
CO2: 28 mmol/L (ref 20–32)
Calcium: 9.2 mg/dL (ref 8.6–10.4)
Chloride: 109 mmol/L (ref 98–110)
Creat: 0.83 mg/dL (ref 0.60–0.93)
GLOBULIN: 2.5 g/dL (ref 1.9–3.7)
Glucose, Bld: 107 mg/dL — ABNORMAL HIGH (ref 65–99)
POTASSIUM: 4.3 mmol/L (ref 3.5–5.3)
SODIUM: 144 mmol/L (ref 135–146)
TOTAL PROTEIN: 6.5 g/dL (ref 6.1–8.1)

## 2016-10-30 LAB — LIPID PANEL
CHOL/HDL RATIO: 2.2 (calc) (ref <5.0)
Cholesterol: 148 mg/dL (ref <200)
HDL: 66 mg/dL (ref >50)
LDL CHOLESTEROL (CALC): 67 mg/dL
Non-HDL Cholesterol (Calc): 82 mg/dL (calc) (ref <130)
Triglycerides: 73 mg/dL (ref <150)

## 2016-10-30 LAB — TSH: TSH: 0.08 m[IU]/L — AB (ref 0.40–4.50)

## 2016-11-04 ENCOUNTER — Encounter: Payer: Self-pay | Admitting: Family Medicine

## 2016-11-05 ENCOUNTER — Ambulatory Visit (INDEPENDENT_AMBULATORY_CARE_PROVIDER_SITE_OTHER): Payer: Medicare Other | Admitting: Family Medicine

## 2016-11-05 ENCOUNTER — Encounter: Payer: Self-pay | Admitting: Family Medicine

## 2016-11-05 VITALS — BP 124/88 | HR 72 | Ht 63.5 in | Wt 150.4 lb

## 2016-11-05 DIAGNOSIS — E038 Other specified hypothyroidism: Secondary | ICD-10-CM | POA: Diagnosis not present

## 2016-11-05 DIAGNOSIS — E7800 Pure hypercholesterolemia, unspecified: Secondary | ICD-10-CM

## 2016-11-05 DIAGNOSIS — Z853 Personal history of malignant neoplasm of breast: Secondary | ICD-10-CM | POA: Diagnosis not present

## 2016-11-05 DIAGNOSIS — Z0000 Encounter for general adult medical examination without abnormal findings: Secondary | ICD-10-CM | POA: Diagnosis not present

## 2016-11-05 DIAGNOSIS — M8580 Other specified disorders of bone density and structure, unspecified site: Secondary | ICD-10-CM | POA: Diagnosis not present

## 2016-11-05 DIAGNOSIS — Z923 Personal history of irradiation: Secondary | ICD-10-CM | POA: Diagnosis not present

## 2016-11-05 MED ORDER — LEVOTHYROXINE SODIUM 88 MCG PO TABS: 88.0000 ug | ORAL_TABLET | Freq: Every day | ORAL | 0 refills | Status: DC

## 2016-11-05 NOTE — Progress Notes (Signed)
Tamara Evans is a 71 y.o. female who presents for annual wellness visit and follow-up on chronic medical conditions.  She has the following concerns: He does have a previous history of breast cancer with radiation. 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 She gets yearly mammograms She also has a history of osteopenia and does take a multivitamin with iron. She continues on her thyroid medication and is having no difficulty with that. She also has a previous history of hyperlipidemia. She is taking Lipitor for that. She is now retired and seems to be enjoying her retirement. She has no other concerns or complaints. She does not smoke and exercise regularly. Alcohol is not a problem. Anoscopy was 2015  Immunizations and Health Maintenance Immunization History  Administered Date(s) Administered  . DT 07/04/1994, 05/22/2004  . Influenza Split 12/15/2000, 10/29/2010, 11/17/2012  . Influenza, High Dose Seasonal PF 10/22/2013, 10/24/2014, 10/30/2015  . Influenza-Unspecified 10/30/2016  . Pneumococcal Conjugate-13 10/22/2013  . Pneumococcal Polysaccharide-23 09/03/2007  . Tdap 02/11/2011  . Zoster 09/08/2007   Health Maintenance Due  Topic Date Due  . PNA vac Low Risk Adult (2 of 2 - PPSV23) 10/23/2014    Last Pap smear:N/A Last mammogram:2017 Last colonoscopy: Last DEXA:2017 Dentist:?He may to switch. Ophtho:[place] [place] an hour and half  Other doctors caring for patient include:as above  Advanced directives: Yes. asked for a copy  Depression screen:  See questionnaire below.  Depression screen [facility] 2/9 11/02/2015 10/24/2014  Decreased Interest 0 1  Down, Depressed, Hopeless 0 1  PHQ - 2 Score 0 2    Fall Risk Screen: see questionnaire below. Fall Risk  11/02/2015 10/24/2014 12/06/2013 10/22/2013  Falls in the past year? No No No No    ADL screen:  See questionnaire below Functional Status Survey:Normal   Review of Systems Constitutional: -,  -unexpected weight change, -anorexia, -fatigue Allergy: -sneezing, -itching, -congestion Dermatology: denies changing moles, rash, lumps ENT: -runny nose, -ear pain, -sore throat,  Cardiology:  -chest pain, -palpitations, -orthopnea, Respiratory: -cough, -shortness of breath, -dyspnea on exertion, -wheezing,  Gastroenterology: -abdominal pain, -nausea, -vomiting, -diarrhea, -constipation, -dysphagia Hematology: -bleeding or bruising problems Musculoskeletal: -arthralgias, -myalgias, -joint swelling, -back pain, - Ophthalmology: -vision changes,  Urology: -dysuria, -difficulty urinating,  -urinary frequency, -urgency, incontinence Neurology: -, -numbness, , -memory loss, -falls, -dizziness    PHYSICAL EXAM:  BP 124/88   Pulse 72   Ht 5' 3.5" (1.613 m)   Wt 150 lb 6.4 oz (68.2 kg)   BMI 26.22 kg/m   General Appearance: Alert, cooperative, no distress, appears stated age Head: Normocephalic, without obvious abnormality, atraumatic Eyes: PERRL, conjunctiva/corneas clear, EOM's intact, fundi benign Ears: Normal TM's and external ear canals Nose: Nares normal, mucosa normal, no drainage or sinus tenderness Throat: Lips, mucosa, and tongue normal; teeth and gums normal Neck: Supple, no lymphadenopathy;  thyroid:  no enlargement/tenderness/nodules; no carotid bruit or JVD Lungs: Clear to auscultation bilaterally without wheezes, rales or ronchi; respirations unlabored Heart: Regular rate and rhythm, S1 and S2 normal, no murmur, rubor gallop Abdomen: Soft, non-tender, nondistended, normoactive bowel sounds,  no masses, no hepatosplenomegaly Extremities: No clubbing, cyanosis or edema Pulses: 2+ and symmetric all extremities Skin:  Skin color, texture, turgor normal, no rashes or lesions Lymph nodes: Cervical, supraclavicular, and axillary nodes normal Neurologic:  CNII-XII intact, normal strength, sensation and gait; reflexes 2+ and symmetric throughout Psych: Normal mood, affect,  hygiene and grooming.  ASSESSMENT/PLAN: Routine general medical examination at  a health care facility  Pure hypercholesterolemia  Osteopenia, unspecified location  Other specified hypothyroidism - Plan: levothyroxine (SYNTHROID, LEVOTHROID) 88 MCG tablet  History of breast cancer  Hx of radiation therapy     Immunization recommendations discussed.  Colonoscopy recommendations reviewed   Medicare Attestation I have personally reviewed: The patient's medical and social history Their use of alcohol, tobacco or illicit drugs Their current medications and supplements The patient's functional ability including ADLs,fall risks, home safety risks, cognitive, and hearing and visual impairment Diet and physical activities Evidence for depression or mood disorders  The patient's weight, height, and BMI have been recorded in the chart.  I have made referrals, counseling, and provided education to the patient based on review of the above and I have provided the patient with a written personalized care plan for preventive services.     Wyatt Haste, MD   11/05/2016

## 2016-11-05 NOTE — Patient Instructions (Signed)
  Tamara Evans , Thank you for taking time to come for your Medicare Wellness Visit. I appreciate your ongoing commitment to your health goals. Please review the following plan we discussed and let me know if I can assist you in the future.   These are the goals we discussed: Goals    None      This is a list of the screening recommended for you and due dates:  Health Maintenance  Topic Date Due  . Pneumonia vaccines (2 of 2 - PPSV23) 10/23/2014  . Mammogram  01/03/2018  . Tetanus Vaccine  02/10/2021  . Colon Cancer Screening  01/04/2024  . Flu Shot  Completed  . DEXA scan (bone density measurement)  Completed  .  Hepatitis C: One time screening is recommended by Center for Disease Control  (CDC) for  adults born from [birth year] through 1964.   Completed

## 2016-11-29 ENCOUNTER — Other Ambulatory Visit: Payer: Self-pay | Admitting: Family Medicine

## 2016-11-29 DIAGNOSIS — Z853 Personal history of malignant neoplasm of breast: Secondary | ICD-10-CM

## 2016-12-10 ENCOUNTER — Other Ambulatory Visit: Payer: Self-pay | Admitting: Family Medicine

## 2016-12-10 DIAGNOSIS — E038 Other specified hypothyroidism: Secondary | ICD-10-CM

## 2016-12-31 ENCOUNTER — Telehealth: Payer: Self-pay | Admitting: Adult Health

## 2016-12-31 NOTE — Telephone Encounter (Signed)
PAL. Moved 12/7 visit to 12/13. Left message. Schedule mailed.

## 2017-01-07 ENCOUNTER — Other Ambulatory Visit: Payer: Medicare Other

## 2017-01-09 ENCOUNTER — Encounter: Payer: Medicare Other | Admitting: Adult Health

## 2017-01-10 ENCOUNTER — Ambulatory Visit
Admission: RE | Admit: 2017-01-10 | Discharge: 2017-01-10 | Disposition: A | Discharge status: Home / Self Care | Payer: Medicare Other | Source: Ambulatory Visit | Attending: Family Medicine | Admitting: Family Medicine

## 2017-01-10 ENCOUNTER — Other Ambulatory Visit: Payer: Self-pay | Admitting: Family Medicine

## 2017-01-10 ENCOUNTER — Other Ambulatory Visit: Payer: Medicare Other

## 2017-01-10 DIAGNOSIS — Z853 Personal history of malignant neoplasm of breast: Secondary | ICD-10-CM

## 2017-01-10 DIAGNOSIS — E039 Hypothyroidism, unspecified: Secondary | ICD-10-CM

## 2017-01-10 HISTORY — DX: Personal history of irradiation: Z92.3

## 2017-01-10 LAB — TSH: TSH: 0.24 mIU/L — ABNORMAL LOW (ref 0.40–4.50)

## 2017-01-12 MED ORDER — LEVOTHYROXINE SODIUM 75 MCG PO TABS: 75.0000 ug | ORAL_TABLET | Freq: Every day | ORAL | 0 refills | Status: DC

## 2017-01-13 ENCOUNTER — Ambulatory Visit (HOSPITAL_BASED_OUTPATIENT_CLINIC_OR_DEPARTMENT_OTHER): Payer: Medicare Other | Admitting: Adult Health

## 2017-01-13 ENCOUNTER — Encounter: Payer: Self-pay | Admitting: Adult Health

## 2017-01-13 ENCOUNTER — Other Ambulatory Visit: Payer: Self-pay

## 2017-01-13 DIAGNOSIS — M8580 Other specified disorders of bone density and structure, unspecified site: Secondary | ICD-10-CM | POA: Diagnosis not present

## 2017-01-13 DIAGNOSIS — Z853 Personal history of malignant neoplasm of breast: Secondary | ICD-10-CM

## 2017-01-13 DIAGNOSIS — E038 Other specified hypothyroidism: Secondary | ICD-10-CM

## 2017-01-13 NOTE — Progress Notes (Signed)
CLINIC:  Survivorship   REASON FOR VISIT:  Routine follow-up for history of breast cancer.   BRIEF ONCOLOGIC HISTORY:    History of breast cancer   10/05/2010 Surgery    Right breast lumpectomy 0.8 cm invasive ductal carcinoma grade 2. 0/2 SLN. ER +100% PR +40% Ki 67: 64% with HER-2/neu Neg ratio of 1.07; Oncotype 23      11/12/2010 - 12/25/2010 Radiation Therapy    Adjuvant XRT      01/12/2011 - 01/10/2016 Anti-estrogen oral therapy    Letrozole 2.5 mg daily completed 5 years. Breast cancer index revealed low likelihood of benefit from extended adjuvant therapy. Risk of recurrence 5.7% yr 5-10        INTERVAL HISTORY:  Tamara Evans presents to the [facility] today for routine follow-up for her history of breast cancer.  Overall, she reports feeling quite well.  She did undergo mammogram on 12/14.  She noted that her right nipple has intermittent soreness, redness.  She says this has been going on for a couple of weeks.  She denies any pain or discharge at the nipple.    She has a PCP, Dr. Jill Alexanders that she sees regularly.  She does not routinely have skin cancer screening.  She has undergone colon cancer screening and is up to date.  No longer needs Pap smears.  She exercises three times per week at the [facility], volunteers here, and volunteers at [employer].     REVIEW OF SYSTEMS:  Review of Systems  Constitutional: Negative for appetite change, chills, fatigue, fever and unexpected weight change.  HENT:   Negative for hearing loss and lump/mass.   Eyes: Negative for eye problems and icterus.  Respiratory: Negative for chest tightness, cough and shortness of breath.   Cardiovascular: Negative for chest pain, leg swelling and palpitations.  Gastrointestinal: Negative for abdominal distention and abdominal pain.  Endocrine: Negative for hot flashes.  Genitourinary: Negative for difficulty urinating.   Musculoskeletal: Negative for arthralgias and back pain.  Skin:  Negative for itching and rash.  Neurological: Negative for dizziness, extremity weakness and headaches.  Hematological: Negative for adenopathy. Does not bruise/bleed easily.  Psychiatric/Behavioral: Negative for depression. The patient is not nervous/anxious.          PAST MEDICAL/SURGICAL HISTORY:  Past Medical History:  Diagnosis Date  . Allergy    perennial  . Breast cancer ([place]) 2012   right breast stage I, er/pr positive, invasive ductal CA. Treated with lumpectomy and radiation  . Fibrocystic breast disease   . Goiter, nodular 05/06   managed by Dr. Wilson Singer  . Hx of radiation therapy    right breast  . Hyperlipidemia   . Menopause   . Multinodular goiter   . Osteopenia 6/07   L-spine  . Personal history of radiation therapy   . PMS (premenstrual syndrome)    Past Surgical History:  Procedure Laterality Date  . BREAST LUMPECTOMY  10/05/10   lumpectomy and sentinel node biopsy (Right)  . CERVICAL POLYPECTOMY     in the patient's 20's     ALLERGIES:  Allergies  Allergen Reactions  . Codeine Shortness Of Breath     CURRENT MEDICATIONS:  Outpatient Encounter Medications as of 01/13/2017  Medication Sig  . aspirin 81 MG tablet Take 81 mg by mouth daily.    Marland Kitchen atorvastatin (LIPITOR) 20 MG tablet TAKE 1 TABLET BY MOUTH DAILY  . calcium carbonate (OS-CAL) 600 MG TABS Take 600 mg by mouth 1 day  or 1 dose.   . fish oil-omega-3 fatty acids 1000 MG capsule Take 1 g by mouth daily.    Marland Kitchen levothyroxine (SYNTHROID, LEVOTHROID) 75 MCG tablet Take 1 tablet (75 mcg total) by mouth daily.  . Multiple Vitamin (MULTIVITAMIN) tablet Take 1 tablet by mouth daily.    Marland Kitchen atorvastatin (LIPITOR) 20 MG tablet TAKE ONE TABLET BY MOUTH ONE TIME DAILY (Patient not taking: Reported on 01/13/2017)  . [DISCONTINUED] ciprofloxacin (CIPRO) 250 MG tablet Take 1 tablet (250 mg total) by mouth 2 (two) times daily. (Patient not taking: Reported on 11/05/2016)  . [DISCONTINUED] nitrofurantoin,  macrocrystal-monohydrate, (MACROBID) 100 MG capsule Take 1 capsule (100 mg total) by mouth 2 (two) times daily. (Patient not taking: Reported on 11/05/2016)   No facility-administered encounter medications on file as of 01/13/2017.      ONCOLOGIC FAMILY HISTORY:  Family History  Problem Relation Age of Onset  . Dementia Mother   . Osteoporosis Mother   . Heart disease Mother        pacemaker  . Diabetes Mother   . Hypertension Father   . Hyperlipidemia Father   . Hyperlipidemia Brother   . Cancer Maternal Grandfather        70's, ? type (smoker)  . Diabetes Maternal Grandfather   . Cancer Paternal Grandfather   . Thyroid disease Paternal Grandmother        goiter  . Colon cancer Neg Hx     GENETIC COUNSELING/TESTING: Not indicated  SOCIAL HISTORY:  Tamara Evans is single and lives alone in [place], [place].  She lives here alone, her Chauncey Reading is a friend of hers, but she is looking to change it due to her friends health issues.  Tamara Evans is currently retired but very active (see above).  She denies any current or history of tobacco, alcohol, or illicit drug use.     PHYSICAL EXAMINATION:  Vital Signs: General: Well-nourished, well-appearing female in no acute distress.  Unaccompanied today.   HEENT: Head is normocephalic.  Pupils equal and reactive to light. Conjunctivae clear without exudate.  Sclerae anicteric. Oral mucosa is pink, moist.  Oropharynx is pink without lesions or erythema.  Lymph: No cervical, supraclavicular, or infraclavicular lymphadenopathy noted on palpation.  Cardiovascular: Regular rate and rhythm.Marland Kitchen Respiratory: Clear to auscultation bilaterally. Chest expansion symmetric; breathing non-labored.  Breast Exam:  -Left breast: No appreciable masses on palpation. No skin redness, thickening, or peau d'orange appearance; no nipple retraction or nipple discharge;   -Right breast: No appreciable masses on palpation. No skin redness, thickening, or  peau d'orange appearance; no nipple retraction or nipple discharge; mild distortion in symmetry at previous lumpectomy site well healed scar without erythema or nodularity.  She does have an erythematous right nipple with three circular 22m lesions on the areola.   -Axilla: No axillary adenopathy bilaterally.  GI: Abdomen soft and round; non-tender, non-distended. Bowel sounds normoactive. No hepatosplenomegaly.   GU: Deferred.  Neuro: No focal deficits. Steady gait.  Psych: Mood and affect normal and appropriate for situation.  MSK: No focal spinal tenderness to palpation, full range of motion in bilateral upper extremities Extremities: No edema. Skin: Warm and dry.  LABORATORY DATA:  None for this visit   DIAGNOSTIC IMAGING:  Most recent mammogram:      ASSESSMENT AND PLAN:  Ms.. RCadoganis a pleasant 71y.o. female with history of Stage I right breast invasive ductal carcinoma, ER+/PR+/HER2-, diagnosed in 08/2010, treated with lumpectomy, adjuvant radiation therapy, and anti-estrogen therapy  with Letrozole x 5 years completed in 12/2015.  BCI testing with low likelihood of benefit from extended therapy.  [facility] for [facility] and routine follow-up.   1. History of breast cancer:  Tamara Evans is currently clinically and radiographically without evidence of disease or recurrence of breast cancer for the most part. She will be due for mammogram in 12/2017; orders placed today.   I will send her to see her surgeon about her nipple for full evaluation. I encouraged her to call me with any questions or concerns before her next visit at the cancer center, and I would be happy to see her sooner, if needed.    2. Bone health:  Given Tamara Evans's age, history of breast cancer, and her previous anti-estrogen therapy with Letrozole, she is at risk for bone demineralization. Her last DEXA scan was in 12/2015 with a T score of -2 in the lumbar spine consistent with  osteopneia.  As she is no longer  In the meantime, she was encouraged to increase her consumption of foods rich in calcium, as well as increase her weight-bearing activities.  She was given education on specific food and activities to promote bone health.  3. Cancer screening:  Due to Tamara Evans's history and her age, she should receive screening for skin cancers, colon cancer. She was encouraged to follow-up with her PCP for appropriate cancer screenings.   4. Health maintenance and wellness promotion: Tamara Evans was encouraged to consume 5-7 servings of fruits and vegetables per day. She was also encouraged to engage in moderate to vigorous exercise for 30 minutes per day most days of the week. She was instructed to limit her alcohol consumption and continue to abstain from tobacco use.     Dispo:  -Return to cancer center in one year for LTS -Follow up with Dr. Donne Hazel in the beginning of January -Mammogram in 12/2017 or sooner if clinically indicated   A total of (30) minutes of face-to-face time was spent with this patient with greater than 50% of that time in counseling and care-coordination.   Gardenia Phlegm, NP Survivorship Program [facility] [phone]   Note: PRIMARY CARE PROVIDER Denita Lung, [place] [phone]

## 2017-03-10 ENCOUNTER — Other Ambulatory Visit: Payer: Self-pay | Admitting: Family Medicine

## 2017-03-18 ENCOUNTER — Other Ambulatory Visit: Payer: Medicare Other

## 2017-03-25 ENCOUNTER — Other Ambulatory Visit: Payer: Medicare Other

## 2017-03-25 ENCOUNTER — Telehealth: Payer: Self-pay | Admitting: Internal Medicine

## 2017-03-25 DIAGNOSIS — E038 Other specified hypothyroidism: Secondary | ICD-10-CM

## 2017-03-25 NOTE — Addendum Note (Signed)
Addended by: Tyrone Apple on: 03/25/2017 10:27 AM   Modules accepted: Orders

## 2017-03-25 NOTE — Telephone Encounter (Signed)
Let her know that she has received both pneumonia shots and recommend she go to the drugstore to get the Shingrix.

## 2017-03-25 NOTE — Telephone Encounter (Signed)
Pt came in for lab work today and advised me that she is only getting her meds refilled for 90 days at a time and not a year like it has been in the past. I advised her that I would ask if we could send in a year supply once lab work come back  Pt also wants to know about the shingrix shot and pneumonia shot, we are currently out of the shingrix and I advised pt of that.

## 2017-03-26 LAB — TSH: TSH: 5.91 u[IU]/mL — AB (ref 0.450–4.500)

## 2017-03-26 NOTE — Telephone Encounter (Signed)
Pt was advised that she has had both pneu vaccine and can get shingrix at her pharmacy Thanks [facility]

## 2017-03-27 ENCOUNTER — Encounter: Payer: Self-pay | Admitting: Family Medicine

## 2017-03-27 MED ORDER — LEVOTHYROXINE SODIUM 75 MCG PO TABS: 75.0000 ug | ORAL_TABLET | Freq: Every day | ORAL | 3 refills | Status: DC

## 2017-05-20 ENCOUNTER — Ambulatory Visit: Payer: Medicare Other | Admitting: Family Medicine

## 2017-05-20 ENCOUNTER — Encounter: Payer: Self-pay | Admitting: Family Medicine

## 2017-05-20 VITALS — BP 136/84 | HR 66 | Temp 98.0°F | Ht 64.0 in | Wt 148.8 lb

## 2017-05-20 DIAGNOSIS — R42 Dizziness and giddiness: Secondary | ICD-10-CM

## 2017-05-20 MED ORDER — ONDANSETRON HCL 4 MG PO TABS: 4.0000 mg | ORAL_TABLET | Freq: Three times a day (TID) | ORAL | 0 refills | Status: DC | PRN

## 2017-05-20 NOTE — Progress Notes (Signed)
   Subjective:    Patient ID: Tamara Evans, female    DOB: August 07, 1945, 72 y.o.   MRN: [id number]  HPI She states that she woke up yesterday and doing fine but mid morning she developed nausea with some vomiting and diarrhea and also at times said she felt like she had vertigo.  She was seen in a walk-in clinic last night and given something for her nausea which helps.  Today she feels like she has some slight occipital pressure and lightheaded with some nausea but has not vomited.  No true headache, blurred vision, double vision, weakness.  She also states that she does have a lower lip tremor but says that that has been present for a long period of time.   Review of Systems     Objective:   Physical Exam Alert and in no distress.  EOMI.  Other cranial nerves grossly intact.  DTRs normal.  No carotid bruits noted.  Negative cerebellar testing tympanic membranes and canals are normal. Pharyngeal area is normal. Neck is supple without adenopathy or thyromegaly. Cardiac exam shows a regular sinus rhythm without murmurs or gallops. Lungs are clear to auscultation.        Assessment & Plan:  Dizziness - Plan: CBC with Differential/Platelet, Comprehensive metabolic panel I explained that at this time I see nothing of major significance.  I will do blood work and follow-up pending results of that.  We will also give ondansetron.

## 2017-05-21 ENCOUNTER — Encounter: Payer: Self-pay | Admitting: Family Medicine

## 2017-05-21 LAB — COMPREHENSIVE METABOLIC PANEL
A/G RATIO: 1.9 (ref 1.2–2.2)
ALT: 31 IU/L (ref 0–32)
AST: 26 IU/L (ref 0–40)
Albumin: 4.6 g/dL (ref 3.5–4.8)
Alkaline Phosphatase: 87 IU/L (ref 39–117)
BILIRUBIN TOTAL: 0.6 mg/dL (ref 0.0–1.2)
BUN / CREAT RATIO: 24 (ref 12–28)
BUN: 21 mg/dL (ref 8–27)
CHLORIDE: 105 mmol/L (ref 96–106)
CO2: 27 mmol/L (ref 20–29)
Calcium: 9.5 mg/dL (ref 8.7–10.3)
Creatinine, Ser: 0.87 mg/dL (ref 0.57–1.00)
GFR calc Af Amer: 78 mL/min/{1.73_m2} (ref >59)
GFR calc non Af Amer: 68 mL/min/{1.73_m2} (ref >59)
GLOBULIN, TOTAL: 2.4 g/dL (ref 1.5–4.5)
Glucose: 106 mg/dL — ABNORMAL HIGH (ref 65–99)
POTASSIUM: 4.2 mmol/L (ref 3.5–5.2)
SODIUM: 145 mmol/L — AB (ref 134–144)
Total Protein: 7 g/dL (ref 6.0–8.5)

## 2017-05-21 LAB — CBC WITH DIFFERENTIAL/PLATELET
BASOS: 0 %
Basophils Absolute: 0 10*3/uL (ref 0.0–0.2)
EOS (ABSOLUTE): 0.1 10*3/uL (ref 0.0–0.4)
EOS: 1 %
HEMATOCRIT: 47.8 % — AB (ref 34.0–46.6)
Hemoglobin: 15.4 g/dL (ref 11.1–15.9)
Immature Grans (Abs): 0 10*3/uL (ref 0.0–0.1)
Immature Granulocytes: 0 %
Lymphocytes Absolute: 1.6 10*3/uL (ref 0.7–3.1)
Lymphs: 20 %
MCH: 26.5 pg — AB (ref 26.6–33.0)
MCHC: 32.2 g/dL (ref 31.5–35.7)
MCV: 82 fL (ref 79–97)
MONOS ABS: 0.7 10*3/uL (ref 0.1–0.9)
Monocytes: 9 %
NEUTROS ABS: 5.8 10*3/uL (ref 1.4–7.0)
Neutrophils: 70 %
PLATELETS: 219 10*3/uL (ref 150–379)
RBC: 5.81 x10E6/uL — ABNORMAL HIGH (ref 3.77–5.28)
RDW: 15 % (ref 12.3–15.4)
WBC: 8.2 10*3/uL (ref 3.4–10.8)

## 2017-06-03 ENCOUNTER — Other Ambulatory Visit: Payer: Self-pay | Admitting: Family Medicine

## 2017-06-09 ENCOUNTER — Encounter: Payer: Self-pay | Admitting: Family Medicine

## 2017-08-05 ENCOUNTER — Other Ambulatory Visit: Payer: Self-pay | Admitting: Family Medicine

## 2017-09-25 ENCOUNTER — Telehealth: Payer: Self-pay

## 2017-09-25 NOTE — Telephone Encounter (Signed)
Pt was called to remind pt of needing a well visit appt in oct. No answer lvm . Pt also will have a form in the brown folder when appt is made. Tamara Evans

## 2017-10-28 ENCOUNTER — Other Ambulatory Visit: Payer: Self-pay | Admitting: Family Medicine

## 2017-10-28 ENCOUNTER — Telehealth: Payer: Self-pay

## 2017-10-28 DIAGNOSIS — E2839 Other primary ovarian failure: Secondary | ICD-10-CM

## 2017-10-28 DIAGNOSIS — M8580 Other specified disorders of bone density and structure, unspecified site: Secondary | ICD-10-CM

## 2017-10-28 DIAGNOSIS — E038 Other specified hypothyroidism: Secondary | ICD-10-CM

## 2017-10-28 DIAGNOSIS — Z853 Personal history of malignant neoplasm of breast: Secondary | ICD-10-CM

## 2017-10-28 DIAGNOSIS — E7800 Pure hypercholesterolemia, unspecified: Secondary | ICD-10-CM

## 2017-10-28 DIAGNOSIS — Z1231 Encounter for screening mammogram for malignant neoplasm of breast: Secondary | ICD-10-CM

## 2017-10-28 DIAGNOSIS — Z923 Personal history of irradiation: Secondary | ICD-10-CM

## 2017-10-28 NOTE — Telephone Encounter (Signed)
Pt called and scheduled a med well for 11/05/2017 in the afternoon. Stated she needed to have med well done this month. Patient would like orders to be placed for fating labs that she will come in for 10/30/17 or 10/31/17. Please let us know once labs are placed so we can put patient on the schedule.     Patient also wants to know if you want her to have a ultrasound of the thyroid done and a bone density screening, please advise.

## 2017-10-28 NOTE — Telephone Encounter (Signed)
I put the orders in 

## 2017-10-28 NOTE — Telephone Encounter (Signed)
Pt was advise of labs 10-30-17 [facility]

## 2017-10-29 ENCOUNTER — Telehealth: Payer: Self-pay

## 2017-10-29 NOTE — Telephone Encounter (Signed)
Was called form the breast center and advised that pt had bone density done at [facility] last and has a mammo scheduled with them . Pt was called and advised me tha she would like to have mammo done with [facility] center and dexa at [place]. Pt appt for mammo is 01-12-18 at 9am . dexa has not been scheduled. [place]

## 2017-10-30 ENCOUNTER — Encounter: Payer: Self-pay | Admitting: Family Medicine

## 2017-10-30 ENCOUNTER — Other Ambulatory Visit: Payer: Medicare Other

## 2017-10-30 ENCOUNTER — Ambulatory Visit: Payer: Medicare Other | Admitting: Family Medicine

## 2017-10-30 VITALS — BP 128/82 | HR 65 | Temp 97.6°F | Wt 149.6 lb

## 2017-10-30 DIAGNOSIS — E038 Other specified hypothyroidism: Secondary | ICD-10-CM | POA: Diagnosis not present

## 2017-10-30 DIAGNOSIS — E041 Nontoxic single thyroid nodule: Secondary | ICD-10-CM

## 2017-10-30 DIAGNOSIS — Z853 Personal history of malignant neoplasm of breast: Secondary | ICD-10-CM

## 2017-10-30 DIAGNOSIS — N3001 Acute cystitis with hematuria: Secondary | ICD-10-CM

## 2017-10-30 DIAGNOSIS — Z923 Personal history of irradiation: Secondary | ICD-10-CM

## 2017-10-30 DIAGNOSIS — M8580 Other specified disorders of bone density and structure, unspecified site: Secondary | ICD-10-CM

## 2017-10-30 DIAGNOSIS — Z23 Encounter for immunization: Secondary | ICD-10-CM

## 2017-10-30 DIAGNOSIS — E7800 Pure hypercholesterolemia, unspecified: Secondary | ICD-10-CM

## 2017-10-30 LAB — POCT URINALYSIS DIP (PROADVANTAGE DEVICE)
BILIRUBIN UA: NEGATIVE mg/dL
Bilirubin, UA: NEGATIVE
GLUCOSE UA: NEGATIVE mg/dL
NITRITE UA: NEGATIVE
Specific Gravity, Urine: 1.03
Urobilinogen, Ur: 3.5
pH, UA: 5.5 (ref 5.0–8.0)

## 2017-10-30 MED ORDER — SULFAMETHOXAZOLE-TRIMETHOPRIM 800-160 MG PO TABS: 1.0000 | ORAL_TABLET | Freq: Two times a day (BID) | ORAL | 0 refills | Status: DC

## 2017-10-30 NOTE — Progress Notes (Addendum)
   Subjective:    Patient ID: Tamara Evans, female    DOB: 1945/08/22, 72 y.o.   MRN: [id number]  HPI She originally came in for blood draw prior to wellness visit.  She has had a frequency and dysuria.  Review of the record indicates she did take Fosamax for approximately 9 years.  She apparently did have 1 dose of Prolia but it was very expensive.  Her last DEXA scan showed osteopenia.  She also has a history of thyroid nodule and was cared for by Dr. Lajuan Lines hot who was following her with scans as well as suppression with Synthroid.   Review of Systems     Objective:   Physical Exam Alert and in no distress otherwise not examined Last thyroid ultrasound showed no change.      Assessment & Plan:  Need for influenza vaccination - Plan: Flu vaccine HIGH DOSE PF (Fluzone High dose)  History of breast cancer - Plan: CBC with Differential/Platelet, Comprehensive metabolic panel  Other specified hypothyroidism - Plan: CBC with Differential/Platelet, Comprehensive metabolic panel, TSH  Osteopenia, unspecified location - Plan: DG Bone Density  Pure hypercholesterolemia - Plan: Lipid panel  Hx of radiation therapy  Acute cystitis with hematuria - Plan: CBC with Differential/Platelet, Comprehensive metabolic panel, sulfamethoxazole-trimethoprim (BACTRIM DS,SEPTRA DS) 800-160 MG tablet  Thyroid nodule Routine blood work was done.  She is placed on Septra.  We will recheck her urine in several weeks when she comes back for her wellness exam.  TSH is elevated.  I will therefore increase her Synthroid preparation.

## 2017-10-31 ENCOUNTER — Telehealth: Payer: Self-pay | Admitting: Family Medicine

## 2017-10-31 LAB — COMPREHENSIVE METABOLIC PANEL
A/G RATIO: 1.8 (ref 1.2–2.2)
ALT: 27 IU/L (ref 0–32)
AST: 22 IU/L (ref 0–40)
Albumin: 4.2 g/dL (ref 3.5–4.8)
Alkaline Phosphatase: 86 IU/L (ref 39–117)
BILIRUBIN TOTAL: 0.4 mg/dL (ref 0.0–1.2)
BUN/Creatinine Ratio: 22 (ref 12–28)
BUN: 19 mg/dL (ref 8–27)
CHLORIDE: 105 mmol/L (ref 96–106)
CO2: 25 mmol/L (ref 20–29)
Calcium: 9.5 mg/dL (ref 8.7–10.3)
Creatinine, Ser: 0.87 mg/dL (ref 0.57–1.00)
GFR calc Af Amer: 78 mL/min/{1.73_m2} (ref >59)
GFR calc non Af Amer: 67 mL/min/{1.73_m2} (ref >59)
Globulin, Total: 2.4 g/dL (ref 1.5–4.5)
Glucose: 95 mg/dL (ref 65–99)
POTASSIUM: 4.4 mmol/L (ref 3.5–5.2)
Sodium: 146 mmol/L — ABNORMAL HIGH (ref 134–144)
TOTAL PROTEIN: 6.6 g/dL (ref 6.0–8.5)

## 2017-10-31 LAB — CBC WITH DIFFERENTIAL/PLATELET
Basophils Absolute: 0 10*3/uL (ref 0.0–0.2)
Basos: 0 %
EOS (ABSOLUTE): 0.2 10*3/uL (ref 0.0–0.4)
Eos: 2 %
Hematocrit: 45.1 % (ref 34.0–46.6)
Hemoglobin: 14.3 g/dL (ref 11.1–15.9)
Immature Grans (Abs): 0 10*3/uL (ref 0.0–0.1)
Immature Granulocytes: 0 %
Lymphocytes Absolute: 1.2 10*3/uL (ref 0.7–3.1)
Lymphs: 16 %
MCH: 25.9 pg — ABNORMAL LOW (ref 26.6–33.0)
MCHC: 31.7 g/dL (ref 31.5–35.7)
MCV: 82 fL (ref 79–97)
Monocytes Absolute: 0.6 10*3/uL (ref 0.1–0.9)
Monocytes: 8 %
Neutrophils Absolute: 5.5 10*3/uL (ref 1.4–7.0)
Neutrophils: 74 %
Platelets: 231 10*3/uL (ref 150–450)
RBC: 5.52 x10E6/uL — ABNORMAL HIGH (ref 3.77–5.28)
RDW: 13.4 % (ref 12.3–15.4)
WBC: 7.5 10*3/uL (ref 3.4–10.8)

## 2017-10-31 LAB — LIPID PANEL
Chol/HDL Ratio: 2.4 ratio (ref 0.0–4.4)
Cholesterol, Total: 160 mg/dL (ref 100–199)
HDL: 68 mg/dL (ref >39)
LDL Calculated: 70 mg/dL (ref 0–99)
Triglycerides: 110 mg/dL (ref 0–149)
VLDL Cholesterol Cal: 22 mg/dL (ref 5–40)

## 2017-10-31 LAB — TSH: TSH: 5.59 u[IU]/mL — ABNORMAL HIGH (ref 0.450–4.500)

## 2017-10-31 MED ORDER — LEVOTHYROXINE SODIUM 88 MCG PO TABS: 88.0000 ug | ORAL_TABLET | Freq: Every day | ORAL | 3 refills | Status: DC

## 2017-10-31 NOTE — Telephone Encounter (Signed)
  Pharmacy called about new synthroid rx States pt was getting brand name with previous dose Do you want brand name on new dose or generic

## 2017-10-31 NOTE — Telephone Encounter (Signed)
Yes

## 2017-10-31 NOTE — Addendum Note (Signed)
Addended by: Denita Lung on: 10/31/2017 12:41 PM   Modules accepted: Orders

## 2017-11-04 ENCOUNTER — Encounter: Payer: Self-pay | Admitting: Family Medicine

## 2017-11-04 ENCOUNTER — Ambulatory Visit: Payer: Medicare Other | Admitting: Family Medicine

## 2017-11-04 VITALS — BP 120/82 | HR 68 | Temp 98.0°F | Ht 63.5 in | Wt 150.8 lb

## 2017-11-04 DIAGNOSIS — E7800 Pure hypercholesterolemia, unspecified: Secondary | ICD-10-CM

## 2017-11-04 DIAGNOSIS — Z923 Personal history of irradiation: Secondary | ICD-10-CM

## 2017-11-04 DIAGNOSIS — Z0000 Encounter for general adult medical examination without abnormal findings: Secondary | ICD-10-CM

## 2017-11-04 DIAGNOSIS — Z853 Personal history of malignant neoplasm of breast: Secondary | ICD-10-CM | POA: Diagnosis not present

## 2017-11-04 DIAGNOSIS — M8580 Other specified disorders of bone density and structure, unspecified site: Secondary | ICD-10-CM

## 2017-11-04 DIAGNOSIS — J301 Allergic rhinitis due to pollen: Secondary | ICD-10-CM

## 2017-11-04 NOTE — Progress Notes (Signed)
Tamara Evans is a 72 y.o. female who presents for annual wellness visit and follow-up on chronic medical conditions.  She has underlying thyroid disease and presently here thyroid medication is being readjusted.  Has difficulty with allergies usually in the spring and fall.  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  She does workout regularly and uses a treadmill as well as stairmaster but has no difficulty with that.  She also scheduled for a DEXA scan.  She has a previous history of breast cancer with hormone therapy.  She was placed on Fosamax several years ago but has not been on any recently.  She also has a history of hyperlipidemia.  Otherwise things are going quite well for her.  Immunizations and Health Maintenance Immunization History  Administered Date(s) Administered  . DT 07/04/1994, 05/22/2004  . Influenza Split 12/15/2000, 10/29/2010, 11/17/2012  . Influenza, High Dose Seasonal PF 10/22/2013, 10/24/2014, 10/30/2015, 10/30/2017  . Influenza-Unspecified 10/30/2016  . Pneumococcal Conjugate-13 10/22/2013  . Pneumococcal Polysaccharide-23 09/03/2007  . Tdap 02/11/2011  . Zoster 09/08/2007   Health Maintenance Due  Topic Date Due  . PNA vac Low Risk Adult (2 of 2 - PPSV23) 10/23/2014    Last Pap smear: aged out  Last mammogram: Dec 2018 Last colonoscopy: 01-03-14 Last DEXA: 01-03-16 Dentist: 09-2017 Ophtho:09-2017 Exercise:ymca hour and half 3x a week  Other doctors caring for patient include:[facility]  Advanced directives:  Does Patient Have a Medical Advance Directive?: Yes Type of Advance Directive: Healthcare Power of Attorney, Living will Does patient want to make changes to medical advance directive?: No - Patient declined  Depression screen:  See questionnaire below.  Depression screen [facility] 2/9 11/04/2017 11/05/2016 11/02/2015 10/24/2014  Decreased Interest 0 0 0 1  Down, Depressed, Hopeless 0  1 0 1  PHQ - 2 Score 0 1 0 2    Fall Risk Screen: see questionnaire below. Fall Risk  11/04/2017 11/05/2016 11/02/2015 10/24/2014 12/06/2013  Falls in the past year? No No No No No    ADL screen:  See questionnaire below Functional Status Survey: Is the patient deaf or have difficulty hearing?: No Does the patient have difficulty seeing, even when wearing glasses/contacts?: No Does the patient have difficulty concentrating, remembering, or making decisions?: No Does the patient have difficulty walking or climbing stairs?: Yes(knee issues ) Does the patient have difficulty dressing or bathing?: No Does the patient have difficulty doing errands alone such as visiting a doctor's office or shopping?: No   Review of Systems Constitutional: -, -unexpected weight change, -anorexia, -fatigue Allergy: -sneezing, -itching, -congestion Dermatology: denies changing moles, rash, lumps ENT: -runny nose, -ear pain, -sore throat,  Cardiology:  -chest pain, -palpitations, -orthopnea, Respiratory: -cough, -shortness of breath, -dyspnea on exertion, -wheezing,  Gastroenterology: -abdominal pain, -nausea, -vomiting, -diarrhea, -constipation, -dysphagia Hematology: -bleeding or bruising problems Musculoskeletal: -arthralgias, -myalgias, -joint swelling, -back pain, - Ophthalmology: -vision changes,  Urology: -dysuria, -difficulty urinating,  -urinary frequency, -urgency, incontinence Neurology: -, -numbness, , -memory loss, -falls, -dizziness    PHYSICAL EXAM:   General Appearance: Alert, cooperative, no distress, appears stated age Head: Normocephalic, without obvious abnormality, atraumatic Eyes: PERRL, conjunctiva/corneas clear, EOM's intact, fundi benign Ears: Normal TM's and external ear canals Nose: Nares normal, mucosa normal, no drainage or sinus tenderness Throat: Lips, mucosa, and tongue normal; teeth and gums normal Neck: Supple, no lymphadenopathy;  thyroid:  no  enlargement/tenderness/nodules; no carotid bruit or JVD Lungs: Clear to  auscultation bilaterally without wheezes, rales or ronchi; respirations unlabored Heart: Regular rate and rhythm, S1 and S2 normal, no murmur, rubor gallop Abdomen: Soft, non-tender, nondistended, normoactive bowel sounds,  no masses, no hepatosplenomegaly Extremities: No clubbing, cyanosis or edema Pulses: 2+ and symmetric all extremities Skin:  Skin color, texture, turgor normal, no rashes or lesions Lymph nodes: Cervical, supraclavicular, and axillary nodes normal Neurologic:  CNII-XII intact, normal strength, sensation and gait; reflexes 2+ and symmetric throughout Psych: Normal mood, affect, hygiene and grooming.  ASSESSMENT/PLAN: History of breast cancer  Osteopenia, unspecified location  Pure hypercholesterolemia  Hx of radiation therapy  Seasonal allergic rhinitis due to pollen She does plan on using of the thyroid medication that she has at home before she starts on the higher dosing.    ; at least 30 minutes of aerobic activity at least 5 days/week and weight-bearing exercise 2x/week;  healthy diet, including goals of calcium and vitamin D intake and alcohol recommendations (less than or equal to 1 drink/day) reviewed; regular seatbelt use; [place] Kitchen  Immunization recommendations discussed.  Colonoscopy recommendations reviewed   Medicare Attestation I have personally reviewed: The patient's medical and social history Their use of alcohol, tobacco or illicit drugs Their current medications and supplements The patient's functional ability including ADLs,fall risks, home safety risks, cognitive, and hearing and visual impairment Diet and physical activities Evidence for depression or mood disorders  The patient's weight, height, and BMI have been recorded in the chart.  I have made referrals, counseling, and provided education to the patient based on review of the above and I have provided the patient with a  written personalized care plan for preventive services.     Jill Alexanders, MD   11/04/2017

## 2017-11-04 NOTE — Telephone Encounter (Signed)
Done KH 

## 2017-11-05 ENCOUNTER — Ambulatory Visit: Payer: Medicare Other | Admitting: Family Medicine

## 2017-12-12 ENCOUNTER — Other Ambulatory Visit: Payer: Self-pay | Admitting: Family Medicine

## 2017-12-13 ENCOUNTER — Other Ambulatory Visit: Payer: Self-pay | Admitting: Family Medicine

## 2017-12-15 MED ORDER — ATORVASTATIN CALCIUM 20 MG PO TABS: 20.0000 mg | ORAL_TABLET | Freq: Every day | ORAL | 0 refills | Status: DC

## 2018-01-07 LAB — HM DEXA SCAN

## 2018-01-12 ENCOUNTER — Ambulatory Visit
Admission: RE | Admit: 2018-01-12 | Discharge: 2018-01-12 | Disposition: A | Discharge status: Home / Self Care | Payer: Medicare Other | Source: Ambulatory Visit | Attending: Family Medicine | Admitting: Family Medicine

## 2018-01-12 DIAGNOSIS — Z1231 Encounter for screening mammogram for malignant neoplasm of breast: Secondary | ICD-10-CM

## 2018-01-15 ENCOUNTER — Encounter: Payer: Medicare Other | Admitting: Adult Health

## 2018-01-16 ENCOUNTER — Telehealth: Payer: Self-pay

## 2018-01-16 NOTE — Telephone Encounter (Signed)
Per Dr. Redmond School dexa scan show improvement. [place]

## 2018-01-24 ENCOUNTER — Encounter: Payer: Self-pay | Admitting: Family Medicine

## 2018-06-02 ENCOUNTER — Other Ambulatory Visit: Payer: Self-pay | Admitting: Family Medicine

## 2018-08-27 ENCOUNTER — Other Ambulatory Visit: Payer: Self-pay | Admitting: Family Medicine

## 2018-10-13 ENCOUNTER — Telehealth: Payer: Self-pay | Admitting: Family Medicine

## 2018-10-13 DIAGNOSIS — Z923 Personal history of irradiation: Secondary | ICD-10-CM

## 2018-10-13 DIAGNOSIS — E038 Other specified hypothyroidism: Secondary | ICD-10-CM

## 2018-10-13 DIAGNOSIS — J301 Allergic rhinitis due to pollen: Secondary | ICD-10-CM

## 2018-10-13 DIAGNOSIS — E7800 Pure hypercholesterolemia, unspecified: Secondary | ICD-10-CM

## 2018-10-13 DIAGNOSIS — M8580 Other specified disorders of bone density and structure, unspecified site: Secondary | ICD-10-CM

## 2018-10-13 NOTE — Telephone Encounter (Signed)
done

## 2018-10-13 NOTE — Telephone Encounter (Signed)
Pt called & states she has a home health nurse that is coming to her home starting next week and she would like to go ahead and get her fasting lab work done this week, pt has CPE next month here.  Can you put in lab orders for labs and thyroid so she can have the nurse go over those with her next week.

## 2018-10-15 ENCOUNTER — Other Ambulatory Visit: Payer: Medicare Other

## 2018-10-15 ENCOUNTER — Other Ambulatory Visit: Payer: Self-pay

## 2018-10-15 DIAGNOSIS — E038 Other specified hypothyroidism: Secondary | ICD-10-CM

## 2018-10-15 DIAGNOSIS — M8580 Other specified disorders of bone density and structure, unspecified site: Secondary | ICD-10-CM

## 2018-10-15 DIAGNOSIS — E7800 Pure hypercholesterolemia, unspecified: Secondary | ICD-10-CM

## 2018-10-15 DIAGNOSIS — Z923 Personal history of irradiation: Secondary | ICD-10-CM

## 2018-10-16 LAB — COMPREHENSIVE METABOLIC PANEL
ALT: 35 IU/L — ABNORMAL HIGH (ref 0–32)
AST: 32 IU/L (ref 0–40)
Albumin/Globulin Ratio: 1.7 (ref 1.2–2.2)
Albumin: 4.2 g/dL (ref 3.7–4.7)
Alkaline Phosphatase: 80 IU/L (ref 39–117)
BUN/Creatinine Ratio: 36 — ABNORMAL HIGH (ref 12–28)
BUN: 28 mg/dL — ABNORMAL HIGH (ref 8–27)
Bilirubin Total: 0.4 mg/dL (ref 0.0–1.2)
CO2: 25 mmol/L (ref 20–29)
Calcium: 9.6 mg/dL (ref 8.7–10.3)
Chloride: 106 mmol/L (ref 96–106)
Creatinine, Ser: 0.78 mg/dL (ref 0.57–1.00)
GFR calc Af Amer: 88 mL/min/{1.73_m2} (ref >59)
GFR calc non Af Amer: 76 mL/min/{1.73_m2} (ref >59)
Globulin, Total: 2.5 g/dL (ref 1.5–4.5)
Glucose: 118 mg/dL — ABNORMAL HIGH (ref 65–99)
Potassium: 4 mmol/L (ref 3.5–5.2)
Sodium: 145 mmol/L — ABNORMAL HIGH (ref 134–144)
Total Protein: 6.7 g/dL (ref 6.0–8.5)

## 2018-10-16 LAB — CBC WITH DIFFERENTIAL/PLATELET
Basophils Absolute: 0 10*3/uL (ref 0.0–0.2)
Basos: 1 %
EOS (ABSOLUTE): 0.2 10*3/uL (ref 0.0–0.4)
Eos: 3 %
Hematocrit: 44 % (ref 34.0–46.6)
Hemoglobin: 14.2 g/dL (ref 11.1–15.9)
Immature Grans (Abs): 0 10*3/uL (ref 0.0–0.1)
Immature Granulocytes: 0 %
Lymphocytes Absolute: 2 10*3/uL (ref 0.7–3.1)
Lymphs: 35 %
MCH: 25.6 pg — ABNORMAL LOW (ref 26.6–33.0)
MCHC: 32.3 g/dL (ref 31.5–35.7)
MCV: 79 fL (ref 79–97)
Monocytes Absolute: 0.6 10*3/uL (ref 0.1–0.9)
Monocytes: 10 %
Neutrophils Absolute: 2.9 10*3/uL (ref 1.4–7.0)
Neutrophils: 51 %
Platelets: 207 10*3/uL (ref 150–450)
RBC: 5.55 x10E6/uL — ABNORMAL HIGH (ref 3.77–5.28)
RDW: 13.5 % (ref 11.7–15.4)
WBC: 5.8 10*3/uL (ref 3.4–10.8)

## 2018-10-16 LAB — LIPID PANEL
Chol/HDL Ratio: 2.3 ratio (ref 0.0–4.4)
Cholesterol, Total: 146 mg/dL (ref 100–199)
HDL: 64 mg/dL (ref >39)
LDL Chol Calc (NIH): 67 mg/dL (ref 0–99)
Triglycerides: 78 mg/dL (ref 0–149)
VLDL Cholesterol Cal: 15 mg/dL (ref 5–40)

## 2018-10-16 LAB — TSH: TSH: 1.03 u[IU]/mL (ref 0.450–4.500)

## 2018-11-10 ENCOUNTER — Other Ambulatory Visit: Payer: Self-pay

## 2018-11-10 ENCOUNTER — Ambulatory Visit: Payer: Medicare Other | Admitting: Family Medicine

## 2018-11-10 ENCOUNTER — Encounter: Payer: Self-pay | Admitting: Family Medicine

## 2018-11-10 VITALS — BP 126/82 | HR 76 | Temp 98.0°F | Ht 63.5 in | Wt 152.8 lb

## 2018-11-10 DIAGNOSIS — E041 Nontoxic single thyroid nodule: Secondary | ICD-10-CM | POA: Diagnosis not present

## 2018-11-10 DIAGNOSIS — Z853 Personal history of malignant neoplasm of breast: Secondary | ICD-10-CM

## 2018-11-10 DIAGNOSIS — Z923 Personal history of irradiation: Secondary | ICD-10-CM

## 2018-11-10 DIAGNOSIS — E7800 Pure hypercholesterolemia, unspecified: Secondary | ICD-10-CM | POA: Diagnosis not present

## 2018-11-10 DIAGNOSIS — Z0000 Encounter for general adult medical examination without abnormal findings: Secondary | ICD-10-CM

## 2018-11-10 DIAGNOSIS — M8580 Other specified disorders of bone density and structure, unspecified site: Secondary | ICD-10-CM

## 2018-11-10 MED ORDER — ATORVASTATIN CALCIUM 20 MG PO TABS: 20.0000 mg | ORAL_TABLET | Freq: Every day | ORAL | 3 refills | Status: DC

## 2018-11-10 MED ORDER — LEVOTHYROXINE SODIUM 88 MCG PO TABS: 88.0000 ug | ORAL_TABLET | Freq: Every day | ORAL | 3 refills | Status: DC

## 2018-11-10 NOTE — Patient Instructions (Signed)
  Ms. Fitts , Thank you for taking time to come for your Medicare Wellness Visit. I appreciate your ongoing commitment to your health goals. Please review the following plan we discussed and let me know if I can assist you in the future.   These are the goals we discussed: Continue on present medications This is a list of the screening recommended for you and due dates:  Health Maintenance  Topic Date Due  . Mammogram  01/13/2020  . Tetanus Vaccine  02/10/2021  . Colon Cancer Screening  01/04/2024  . Flu Shot  Completed  . DEXA scan (bone density measurement)  Completed  .  Hepatitis C: One time screening is recommended by Center for Disease Control  (CDC) for  adults born from [birth year] through 1964.   Completed  . Pneumonia vaccines  Discontinued

## 2018-11-10 NOTE — Progress Notes (Signed)
Tamara Evans is a 73 y.o. female who presents for annual wellness visit,CPE and follow-up on chronic medical conditions.  She has no particular concerns or complaints.  She has been sticking with her regular diet and exercise regimen.  Her previous hemoglobin A1c was 5.7.  She also continues on calcium and vitamin D to help with her osteopenia.  She does have a previous history of breast cancer and does get regular mammograms.  She has had radiation therapy in the past for this.  She also has a history of hyperlipidemia and is presently taking atorvastatin.  She is also taking thyroid and does have a previous history of a thyroid nodule.  Otherwise she seems to be doing quite nicely.  Family and social history was reviewed  Immunizations and Health Maintenance Immunization History  Administered Date(s) Administered  . DT 07/04/1994, 05/22/2004  . Influenza Split 12/15/2000, 10/29/2010, 11/17/2012  . Influenza, High Dose Seasonal PF 10/22/2013, 10/24/2014, 10/30/2015, 10/30/2016, 10/30/2017, 10/16/2018  . Influenza-Unspecified 10/30/2016  . Pneumococcal Conjugate-13 10/22/2013  . Pneumococcal Polysaccharide-23 09/03/2007  . Tdap 02/11/2011  . Zoster 09/08/2007  . Zoster Recombinat (Shingrix) 08/01/2017, 12/12/2017   There are no preventive care reminders to display for this patient.  Last Pap smear: aged out  Last mammogram: 01-12-18 Last colonoscopy: 01-03-14 Last DEXA: 01-07-18 Dentist: q six months Ophtho:10/2018 Exercise: walking   Other doctors caring for patient include: N/A  Advanced directives: not on file information given Does Patient Have a Medical Advance Directive?: No Would patient like information on creating a medical advance directive?: Yes (MAU/Ambulatory/Procedural Areas - Information given)  Depression screen:  See questionnaire below.  Depression screen [facility] 2/9 11/10/2018 11/04/2017 11/05/2016 11/02/2015 10/24/2014  Decreased Interest 0 0 0 0 1  Down, Depressed,  Hopeless 0 0 1 0 1  PHQ - 2 Score 0 0 1 0 2    Fall Risk Screen: see questionnaire below. Fall Risk  11/10/2018 11/04/2017 11/05/2016 11/02/2015 10/24/2014  Falls in the past year? 0 No No No No    ADL screen:  See questionnaire below Functional Status Survey: Is the patient deaf or have difficulty hearing?: No Does the patient have difficulty seeing, even when wearing glasses/contacts?: No Does the patient have difficulty concentrating, remembering, or making decisions?: No Does the patient have difficulty walking or climbing stairs?: No Does the patient have difficulty dressing or bathing?: No Does the patient have difficulty doing errands alone such as visiting a doctor's office or shopping?: No   Review of Systems Constitutional: -, -unexpected weight change, -anorexia, -fatigue Allergy: -sneezing, -itching, -congestion Dermatology: denies changing moles, rash, lumps ENT: -runny nose, -ear pain, -sore throat,  Cardiology:  -chest pain, -palpitations, -orthopnea, Respiratory: -cough, -shortness of breath, -dyspnea on exertion, -wheezing,  Gastroenterology: -abdominal pain, -nausea, -vomiting, -diarrhea, -constipation, -dysphagia Hematology: -bleeding or bruising problems Musculoskeletal: -arthralgias, -myalgias, -joint swelling, -back pain, - Ophthalmology: -vision changes,  Urology: -dysuria, -difficulty urinating,  -urinary frequency, -urgency, incontinence Neurology: -, -numbness, , -memory loss, -falls, -dizziness    PHYSICAL EXAM:  BP 126/82 (BP Location: Left Arm, Patient Position: Sitting)   Pulse 76   Temp 98 F (36.7 C)   Ht 5' 3.5" (1.613 m)   Wt 152 lb 12.8 oz (69.3 kg)   SpO2 98%   BMI 26.64 kg/m   General Appearance: Alert, cooperative, no distress, appears stated age Head: Normocephalic, without obvious abnormality, atraumatic Eyes: PERRL, conjunctiva/corneas clear, EOM's intact, fundi benign Ears: Normal TM's and external ear canals Nose: Nares normal,  mucosa normal, no drainage or sinus tenderness Throat: Lips, mucosa, and tongue normal; teeth and gums normal Neck: Supple, no lymphadenopathy;  thyroid:  no enlargement/tenderness/nodules; no carotid bruit or JVD Lungs: Clear to auscultation bilaterally without wheezes, rales or ronchi; respirations unlabored Heart: Regular rate and rhythm, S1 and S2 normal, no murmur, rubor gallop Abdomen: Soft, non-tender, nondistended, normoactive bowel sounds,  no masses, no hepatosplenomegaly Skin:  Skin color, texture, turgor normal, no rashes or lesions Lymph nodes: Cervical, supraclavicular, and axillary nodes normal Neurologic:  CNII-XII intact, normal strength, sensation and gait; reflexes 2+ and symmetric throughout Psych: Normal mood, affect, hygiene and grooming.  ASSESSMENT/PLAN: Routine general medical examination at a health care facility  Thyroid nodule - Plan: levothyroxine (SYNTHROID) 88 MCG tablet  Osteopenia, unspecified location  History of breast cancer  Pure hypercholesterolemia - Plan: atorvastatin (LIPITOR) 20 MG tablet  Hx of radiation therapy   goals of calcium and vitamin D intake reviewed;Immunization recommendations discussed.  Colonoscopy recommendations reviewed   Medicare Attestation I have personally reviewed: The patient's medical and social history Their use of alcohol, tobacco or illicit drugs Their current medications and supplements The patient's functional ability including ADLs,fall risks, home safety risks, cognitive, and hearing and visual impairment Diet and physical activities Evidence for depression or mood disorders  The patient's weight, height, and BMI have been recorded in the chart.  I have made referrals, counseling, and provided education to the patient based on review of the above and I have provided the patient with a written personalized care plan for preventive services.     Jill Alexanders, MD   11/10/2018

## 2019-01-07 ENCOUNTER — Other Ambulatory Visit: Payer: Self-pay | Admitting: Family Medicine

## 2019-01-07 DIAGNOSIS — Z1231 Encounter for screening mammogram for malignant neoplasm of breast: Secondary | ICD-10-CM

## 2019-03-01 ENCOUNTER — Other Ambulatory Visit: Payer: Self-pay

## 2019-03-01 ENCOUNTER — Ambulatory Visit
Admission: RE | Admit: 2019-03-01 | Discharge: 2019-03-01 | Disposition: A | Discharge status: Home / Self Care | Payer: Medicare Other | Source: Ambulatory Visit | Attending: Family Medicine | Admitting: Family Medicine

## 2019-03-01 DIAGNOSIS — Z1231 Encounter for screening mammogram for malignant neoplasm of breast: Secondary | ICD-10-CM | POA: Diagnosis not present

## 2019-09-13 ENCOUNTER — Encounter: Payer: Self-pay | Admitting: Family Medicine

## 2019-10-12 ENCOUNTER — Telehealth: Payer: Self-pay

## 2019-10-12 DIAGNOSIS — M8580 Other specified disorders of bone density and structure, unspecified site: Secondary | ICD-10-CM

## 2019-10-12 DIAGNOSIS — E038 Other specified hypothyroidism: Secondary | ICD-10-CM

## 2019-10-12 DIAGNOSIS — Z0000 Encounter for general adult medical examination without abnormal findings: Secondary | ICD-10-CM

## 2019-10-12 DIAGNOSIS — E7800 Pure hypercholesterolemia, unspecified: Secondary | ICD-10-CM

## 2019-10-12 DIAGNOSIS — H40013 Open angle with borderline findings, low risk, bilateral: Secondary | ICD-10-CM | POA: Diagnosis not present

## 2019-10-12 NOTE — Telephone Encounter (Signed)
No follow-up needed for the thyroid at the present time.  The blood work was placed and an order for DEXA scan was placed.

## 2019-10-12 NOTE — Telephone Encounter (Signed)
Pt. Called stating she has CPE on 11/11/19 and wanted to come in for fasting blood work before hand. I got her scheduled on 11/08/19 for labs if you could put the order in for that. The pt. Also mentioned she thinks she is due for bone density and US of the thyroid she wanted to know if she could get them before her CPE so she could discuss the results with you at CPE. If not that's ok.

## 2019-10-13 NOTE — Telephone Encounter (Signed)
Pt. Aware that order placed for labs and DEXA.

## 2019-10-25 ENCOUNTER — Telehealth: Payer: Self-pay

## 2019-10-25 NOTE — Telephone Encounter (Signed)
Pt. Called stating that she needed her order for her DEXA sent in to [facility] instead of the [facility]. If you could change that for her.

## 2019-10-26 NOTE — Telephone Encounter (Signed)
Done and pt was advised KH 

## 2019-11-08 ENCOUNTER — Other Ambulatory Visit: Payer: Self-pay

## 2019-11-08 ENCOUNTER — Other Ambulatory Visit: Payer: Medicare PPO

## 2019-11-08 DIAGNOSIS — Z0000 Encounter for general adult medical examination without abnormal findings: Secondary | ICD-10-CM

## 2019-11-08 DIAGNOSIS — E038 Other specified hypothyroidism: Secondary | ICD-10-CM

## 2019-11-09 LAB — COMPREHENSIVE METABOLIC PANEL
ALT: 33 IU/L — ABNORMAL HIGH (ref 0–32)
AST: 27 IU/L (ref 0–40)
Albumin/Globulin Ratio: 1.6 (ref 1.2–2.2)
Albumin: 4.2 g/dL (ref 3.7–4.7)
Alkaline Phosphatase: 93 IU/L (ref 44–121)
BUN/Creatinine Ratio: 36 — ABNORMAL HIGH (ref 12–28)
BUN: 29 mg/dL — ABNORMAL HIGH (ref 8–27)
Bilirubin Total: 0.4 mg/dL (ref 0.0–1.2)
CO2: 27 mmol/L (ref 20–29)
Calcium: 9.6 mg/dL (ref 8.7–10.3)
Chloride: 103 mmol/L (ref 96–106)
Creatinine, Ser: 0.81 mg/dL (ref 0.57–1.00)
GFR calc Af Amer: 83 mL/min/{1.73_m2} (ref >59)
GFR calc non Af Amer: 72 mL/min/{1.73_m2} (ref >59)
Globulin, Total: 2.7 g/dL (ref 1.5–4.5)
Glucose: 104 mg/dL — ABNORMAL HIGH (ref 65–99)
Potassium: 4.2 mmol/L (ref 3.5–5.2)
Sodium: 142 mmol/L (ref 134–144)
Total Protein: 6.9 g/dL (ref 6.0–8.5)

## 2019-11-09 LAB — LIPID PANEL
Chol/HDL Ratio: 2.6 ratio (ref 0.0–4.4)
Cholesterol, Total: 162 mg/dL (ref 100–199)
HDL: 62 mg/dL (ref >39)
LDL Chol Calc (NIH): 83 mg/dL (ref 0–99)
Triglycerides: 92 mg/dL (ref 0–149)
VLDL Cholesterol Cal: 17 mg/dL (ref 5–40)

## 2019-11-09 LAB — CBC WITH DIFFERENTIAL/PLATELET
Basophils Absolute: 0 10*3/uL (ref 0.0–0.2)
Basos: 1 %
EOS (ABSOLUTE): 0.2 10*3/uL (ref 0.0–0.4)
Eos: 3 %
Hematocrit: 47.9 % — ABNORMAL HIGH (ref 34.0–46.6)
Hemoglobin: 15.4 g/dL (ref 11.1–15.9)
Immature Grans (Abs): 0 10*3/uL (ref 0.0–0.1)
Immature Granulocytes: 0 %
Lymphocytes Absolute: 1.4 10*3/uL (ref 0.7–3.1)
Lymphs: 25 %
MCH: 25.2 pg — ABNORMAL LOW (ref 26.6–33.0)
MCHC: 32.2 g/dL (ref 31.5–35.7)
MCV: 78 fL — ABNORMAL LOW (ref 79–97)
Monocytes Absolute: 0.6 10*3/uL (ref 0.1–0.9)
Monocytes: 10 %
Neutrophils Absolute: 3.5 10*3/uL (ref 1.4–7.0)
Neutrophils: 61 %
Platelets: 227 10*3/uL (ref 150–450)
RBC: 6.11 x10E6/uL — ABNORMAL HIGH (ref 3.77–5.28)
RDW: 15.6 % — ABNORMAL HIGH (ref 11.7–15.4)
WBC: 5.7 10*3/uL (ref 3.4–10.8)

## 2019-11-09 LAB — TSH: TSH: 1.97 u[IU]/mL (ref 0.450–4.500)

## 2019-11-11 ENCOUNTER — Telehealth: Payer: Self-pay | Admitting: Family Medicine

## 2019-11-11 ENCOUNTER — Ambulatory Visit: Payer: Medicare PPO | Admitting: Family Medicine

## 2019-11-11 ENCOUNTER — Other Ambulatory Visit: Payer: Self-pay

## 2019-11-11 ENCOUNTER — Encounter: Payer: Self-pay | Admitting: Family Medicine

## 2019-11-11 VITALS — BP 134/86 | HR 84 | Temp 97.9°F | Ht 63.0 in | Wt 149.2 lb

## 2019-11-11 DIAGNOSIS — E7439 Other disorders of intestinal carbohydrate absorption: Secondary | ICD-10-CM | POA: Diagnosis not present

## 2019-11-11 DIAGNOSIS — J301 Allergic rhinitis due to pollen: Secondary | ICD-10-CM | POA: Diagnosis not present

## 2019-11-11 DIAGNOSIS — E7800 Pure hypercholesterolemia, unspecified: Secondary | ICD-10-CM | POA: Diagnosis not present

## 2019-11-11 DIAGNOSIS — M8580 Other specified disorders of bone density and structure, unspecified site: Secondary | ICD-10-CM | POA: Diagnosis not present

## 2019-11-11 DIAGNOSIS — R7309 Other abnormal glucose: Secondary | ICD-10-CM

## 2019-11-11 DIAGNOSIS — E041 Nontoxic single thyroid nodule: Secondary | ICD-10-CM

## 2019-11-11 DIAGNOSIS — Z853 Personal history of malignant neoplasm of breast: Secondary | ICD-10-CM | POA: Diagnosis not present

## 2019-11-11 DIAGNOSIS — K5790 Diverticulosis of intestine, part unspecified, without perforation or abscess without bleeding: Secondary | ICD-10-CM | POA: Insufficient documentation

## 2019-11-11 DIAGNOSIS — Z0000 Encounter for general adult medical examination without abnormal findings: Secondary | ICD-10-CM

## 2019-11-11 LAB — POCT GLYCOSYLATED HEMOGLOBIN (HGB A1C): Hemoglobin A1C: 6.3 % — AB (ref 4.0–5.6)

## 2019-11-11 MED ORDER — ATORVASTATIN CALCIUM 20 MG PO TABS: 20.0000 mg | ORAL_TABLET | Freq: Every day | ORAL | 3 refills | Status: DC

## 2019-11-11 MED ORDER — LEVOTHYROXINE SODIUM 88 MCG PO TABS: 88.0000 ug | ORAL_TABLET | Freq: Every day | ORAL | 3 refills | Status: DC

## 2019-11-11 NOTE — Telephone Encounter (Signed)
Pt is coming in October 20th 2022 and would like to come in October the 17th for fasting labs if that is ok

## 2019-11-11 NOTE — Progress Notes (Signed)
Tamara Evans is a 74 y.o. female who presents for annual wellness visit,CPE and follow-up on chronic medical conditions.  She has a history of breast cancer from 2012.  She did have radiation.  She also has a history of osteopenia with her last study in 2019 showing an improvement.  She continues on her thyroid medication and is having no difficulty with that.  Previous ultrasound did show a stable nodule.  Review of her blood work does show slightly elevated blood sugar.  Her allergies seem to be under good control.  She is retired and is keeping quite busy.  Social and family history was reviewed.  Immunizations and Health Maintenance Immunization History  Administered Date(s) Administered  . DT (Pediatric) 07/04/1994, 05/22/2004  . Influenza Split 12/15/2000, 10/29/2010, 11/17/2012  . Influenza, High Dose Seasonal PF 10/22/2013, 10/24/2014, 10/30/2015, 10/30/2016, 10/30/2017, 10/16/2018  . Influenza-Unspecified 10/30/2016, 11/04/2019  . PFIZER SARS-COV-2 Vaccination 02/19/2019, 03/15/2019, 10/28/2019  . Pneumococcal Conjugate-13 10/22/2013  . Pneumococcal Polysaccharide-23 09/03/2007  . Tdap 02/11/2011  . Zoster 09/08/2007  . Zoster Recombinat (Shingrix) 08/01/2017, 12/12/2017   There are no preventive care reminders to display for this patient.  Last Pap smear: aged out Last mammogram: 03/02/19 Last colonoscopy: 01/03/14 Last DEXA: 01/07/18 Dentist: Q six months  Ophtho:Q year Exercise: walking 7 days a week  Other doctors caring for patient include: Dr. Carlean P[facility]   Lindi Adie  Advanced directives: Does Patient Have a Medical Advance Directive?: Yes Type of Advance Directive: Living will, Healthcare Power of Attorney, Out of facility DNR (pink MOST or yellow form) Does patient want to make changes to medical advance directive?: No - Patient declined Copy of [place] in Chart?: No - copy requested Would patient like information on creating a medical advance  directive?: No - Patient declined  Depression screen:  See questionnaire below.  Depression screen [facility] 2/9 11/11/2019 11/10/2018 11/04/2017 11/05/2016 11/02/2015  Decreased Interest 0 0 0 0 0  Down, Depressed, Hopeless 0 0 0 1 0  PHQ - 2 Score 0 0 0 1 0    Fall Risk Screen: see questionnaire below. Fall Risk  11/11/2019 11/10/2018 11/04/2017 11/05/2016 11/02/2015  Falls in the past year? 0 0 No No No    ADL screen:  See questionnaire below Functional Status Survey: Is the patient deaf or have difficulty hearing?: No Does the patient have difficulty seeing, even when wearing glasses/contacts?: No Does the patient have difficulty concentrating, remembering, or making decisions?: No Does the patient have difficulty walking or climbing stairs?: No Does the patient have difficulty dressing or bathing?: No Does the patient have difficulty doing errands alone such as visiting a doctor's office or shopping?: No   Review of Systems Constitutional: -, -unexpected weight change, -anorexia, -fatigue Allergy: -sneezing, -itching, -congestion Dermatology: denies changing moles, rash, lumps ENT: -runny nose, -ear pain, -sore throat,  Cardiology:  -chest pain, -palpitations, -orthopnea, Respiratory: -cough, -shortness of breath, -dyspnea on exertion, -wheezing,  Gastroenterology: -abdominal pain, -nausea, -vomiting, -diarrhea, -constipation, -dysphagia Hematology: -bleeding or bruising problems Musculoskeletal: -arthralgias, -myalgias, -joint swelling, -back pain, - Ophthalmology: -vision changes,  Urology: -dysuria, -difficulty urinating,  -urinary frequency, -urgency, incontinence Neurology: -, -numbness, , -memory loss, -falls, -dizziness    PHYSICAL EXAM:  There were no vitals taken for this visit.  General Appearance: Alert, cooperative, no distress, appears stated age Head: Normocephalic, without obvious abnormality, atraumatic Eyes: PERRL, conjunctiva/corneas clear, EOM's  intact, Ears: Normal TM's and external ear canals Nose: Nares normal, mucosa normal, no  drainage or sinus tenderness Throat: Lips, mucosa, and tongue normal; teeth and gums normal Neck: Supple, no lymphadenopathy;  thyroid:  no enlargement/tenderness/nodules; no carotid bruit or JVD Lungs: Clear to auscultation bilaterally without wheezes, rales or ronchi; respirations unlabored Heart: Regular rate and rhythm, S1 and S2 normal, no murmur, rubor gallop Abdomen: Soft, non-tender, nondistended, normoactive bowel sounds,  no masses, no hepatosplenomegaly Extremities: No clubbing, cyanosis or edema Pulses: 2+ and symmetric all extremities Skin:  Skin color, texture, turgor normal, no rashes or lesions Lymph nodes: Cervical, supraclavicular, and axillary nodes normal Neurologic:  CNII-XII intact, normal strength, sensation and gait; reflexes 2+ and symmetric throughout Psych: Normal mood, affect, hygiene and grooming. Hemoglobin A1c is 6.3 ASSESSMENT/PLAN: Routine general medical examination at a health care facility  Thyroid nodule - Plan: levothyroxine (SYNTHROID) 88 MCG tablet  Pure hypercholesterolemia - Plan: atorvastatin (LIPITOR) 20 MG tablet  Elevated glucose - Plan: HgB A1c  History of breast cancer - o  Seasonal allergic rhinitis due to pollen  Osteopenia, unspecified location - Plan: DG Bone Density I explained the diagnosis of diabetes to her and explained that she is now glucose intolerant and not truly diabetic.  Discussed diet and exercise with her in terms of cutting back on carbohydrates.  She does keep her self quite busy.  I will continue her on thyroid and lipid medicine.    Immunization recommendations discussed.  Colonoscopy recommendations reviewed treat allergies as needed.   Medicare Attestation I have personally reviewed: The patient's medical and social history Their use of alcohol, tobacco or illicit drugs Their current medications and supplements The  patient's functional ability including ADLs,fall risks, home safety risks, cognitive, and hearing and visual impairment Diet and physical activities Evidence for depression or mood disorders  The patient's weight, height, and BMI have been recorded in the chart.  I have made referrals, counseling, and provided education to the patient based on review of the above and I have provided the patient with a written personalized care plan for preventive services.     Jill Alexanders, MD   11/11/2019

## 2020-01-14 ENCOUNTER — Other Ambulatory Visit: Payer: Self-pay | Admitting: Family Medicine

## 2020-01-14 DIAGNOSIS — Z1231 Encounter for screening mammogram for malignant neoplasm of breast: Secondary | ICD-10-CM

## 2020-02-15 DIAGNOSIS — M8589 Other specified disorders of bone density and structure, multiple sites: Secondary | ICD-10-CM | POA: Diagnosis not present

## 2020-02-15 LAB — HM DEXA SCAN

## 2020-02-23 ENCOUNTER — Encounter: Payer: Self-pay | Admitting: Family Medicine

## 2020-02-25 ENCOUNTER — Encounter: Payer: Self-pay | Admitting: Family Medicine

## 2020-03-02 ENCOUNTER — Ambulatory Visit: Payer: Medicare PPO

## 2020-04-13 NOTE — Progress Notes (Addendum)
Subjective: Chief Complaint  Patient presents with  . possible uti    Possible uti last couple days. Burning- lower abdominal pain, achy back. Last one in 2018    Tamara Evans is a 75 y.o. female who complains of possible urinary tract infection.  She has had symptoms for 5 days.  Symptoms include urinary frequency, urgency, dysuria, pelvic pressure and low back pain. Patient denies fever, chills, N/V.  Last UTI was 3 years ago.   Using Tylenol and heating pad on her low back for current symptoms.   Back pain improved.   Patient does not have a history of recurrent UTI. Patient does not have a history of pyelonephritis.  No other aggravating or relieving factors.  No other c/o.  Past Medical History:  Diagnosis Date  . Allergy    perennial  . Breast cancer ([place]) 2012   right breast stage I, er/pr positive, invasive ductal CA. Treated with lumpectomy and radiation  . Fibrocystic breast disease   . Goiter, nodular 05/06   managed by Dr. Wilson Singer  . Hx of radiation therapy    right breast  . Hyperlipidemia   . Menopause   . Multinodular goiter   . Osteopenia 6/07   L-spine  . Personal history of radiation therapy   . PMS (premenstrual syndrome)     ROS as in subjective  Reviewed allergies, medications, past medical, surgical, and social history.    Objective: Vitals:   04/14/20 1335  BP: 120/70  Pulse: 73  Temp: 97.8 F (36.6 C)    General appearance: alert, no distress, WD/WN, female Abdomen: +bs, soft, non tender, non distended, no masses, no hepatomegaly, no splenomegaly Back: no CVA tenderness GU: declines      Laboratory:  Urine dipstick: 3+ for hemoglobin.       Assessment: Acute cystitis with hematuria - Plan: sulfamethoxazole-trimethoprim (BACTRIM DS) 800-160 MG tablet, POCT Urinalysis DIP (Proadvantage Device), Urine Culture  Hematuria, unspecified type - Plan: Urine Culture  Dysuria - Plan: Urine Culture    Plan: Discussed symptoms, diagnosis,  possible complications, and usual course of illness.  Bactrim prescribed.   Advised increased water intake, can use OTC Tylenol for pain.    Advised to increase water intake.    Urine culture sent. Follow up for recheck of her urine in 2-3 weeks when back to baseline to ensure resolution of hematuria.   Call or return if worse or not improving.

## 2020-04-14 ENCOUNTER — Other Ambulatory Visit: Payer: Self-pay

## 2020-04-14 ENCOUNTER — Encounter: Payer: Self-pay | Admitting: Family Medicine

## 2020-04-14 ENCOUNTER — Ambulatory Visit: Payer: Medicare PPO | Admitting: Family Medicine

## 2020-04-14 VITALS — BP 120/70 | HR 73 | Temp 97.8°F | Wt 146.6 lb

## 2020-04-14 DIAGNOSIS — R300 Dysuria: Secondary | ICD-10-CM

## 2020-04-14 DIAGNOSIS — N3001 Acute cystitis with hematuria: Secondary | ICD-10-CM

## 2020-04-14 DIAGNOSIS — R319 Hematuria, unspecified: Secondary | ICD-10-CM | POA: Diagnosis not present

## 2020-04-14 LAB — POCT URINALYSIS DIP (PROADVANTAGE DEVICE)
Glucose, UA: NEGATIVE mg/dL
Leukocytes, UA: NEGATIVE
Nitrite, UA: NEGATIVE
Specific Gravity, Urine: 1.03
Urobilinogen, Ur: NEGATIVE
pH, UA: 5.5 (ref 5.0–8.0)

## 2020-04-14 MED ORDER — SULFAMETHOXAZOLE-TRIMETHOPRIM 800-160 MG PO TABS: 1.0000 | ORAL_TABLET | Freq: Two times a day (BID) | ORAL | 0 refills | Status: DC

## 2020-04-14 NOTE — Addendum Note (Signed)
Addended by: Girtha Rm on: 04/14/2020 01:59 PM   Modules accepted: Orders

## 2020-04-14 NOTE — Addendum Note (Signed)
Addended by: Minette Headland A on: 04/14/2020 02:12 PM   Modules accepted: Orders

## 2020-04-17 LAB — URINE CULTURE

## 2020-04-17 NOTE — Progress Notes (Signed)
She needs to be seen again in the next week or two for hematuria. Her urine culture came back negative. I did send her a Pharmacist, community message.

## 2020-04-18 ENCOUNTER — Ambulatory Visit
Admission: RE | Admit: 2020-04-18 | Discharge: 2020-04-18 | Disposition: A | Discharge status: Home / Self Care | Payer: Medicare PPO | Source: Ambulatory Visit | Attending: Family Medicine | Admitting: Family Medicine

## 2020-04-18 ENCOUNTER — Other Ambulatory Visit: Payer: Self-pay

## 2020-04-18 DIAGNOSIS — Z1231 Encounter for screening mammogram for malignant neoplasm of breast: Secondary | ICD-10-CM | POA: Diagnosis not present

## 2020-05-02 ENCOUNTER — Telehealth: Payer: Self-pay

## 2020-05-02 ENCOUNTER — Other Ambulatory Visit: Payer: Self-pay

## 2020-05-02 ENCOUNTER — Other Ambulatory Visit (INDEPENDENT_AMBULATORY_CARE_PROVIDER_SITE_OTHER): Payer: Medicare PPO

## 2020-05-02 DIAGNOSIS — N3001 Acute cystitis with hematuria: Secondary | ICD-10-CM | POA: Diagnosis not present

## 2020-05-02 LAB — POCT URINALYSIS DIP (PROADVANTAGE DEVICE)
Bilirubin, UA: NEGATIVE
Blood, UA: NEGATIVE
Glucose, UA: NEGATIVE mg/dL
Ketones, POC UA: NEGATIVE mg/dL
Nitrite, UA: NEGATIVE
Protein Ur, POC: NEGATIVE mg/dL
Specific Gravity, Urine: 1.03
Urobilinogen, Ur: 0.2
pH, UA: 5.5 (ref 5.0–8.0)

## 2020-05-02 NOTE — Progress Notes (Unsigned)
Repeat U/A has change from last visit. Tamara Evans

## 2020-05-02 NOTE — Telephone Encounter (Signed)
Pt was advised and she stated that  she is still having sam issues as before. Please advise [facility]

## 2020-05-02 NOTE — Telephone Encounter (Signed)
Let her know that her urine is negative.

## 2020-05-02 NOTE — Telephone Encounter (Signed)
Please view pt repeat U/A and advise [facility]

## 2020-05-02 NOTE — Telephone Encounter (Signed)
Pt stated she is still having the same problem but u/a is clear. [place]

## 2020-05-03 NOTE — Telephone Encounter (Signed)
ok 

## 2020-05-03 NOTE — Telephone Encounter (Signed)
the urine was contaminated but if she still having symptoms have recommended we will get a culture

## 2020-05-03 NOTE — Telephone Encounter (Signed)
Pt would like to take some cranberry first to see if this helps please advise [facility]

## 2020-05-03 NOTE — Telephone Encounter (Signed)
Pt was advised [facility] 

## 2020-08-18 DIAGNOSIS — J019 Acute sinusitis, unspecified: Secondary | ICD-10-CM | POA: Diagnosis not present

## 2020-08-18 DIAGNOSIS — R059 Cough, unspecified: Secondary | ICD-10-CM | POA: Diagnosis not present

## 2020-08-24 ENCOUNTER — Telehealth: Payer: Medicare PPO | Admitting: Family Medicine

## 2020-08-24 ENCOUNTER — Encounter: Payer: Self-pay | Admitting: Family Medicine

## 2020-08-24 VITALS — Temp 98.7°F | Wt 146.0 lb

## 2020-08-24 DIAGNOSIS — J019 Acute sinusitis, unspecified: Secondary | ICD-10-CM | POA: Diagnosis not present

## 2020-08-24 MED ORDER — AMOXICILLIN-POT CLAVULANATE 875-125 MG PO TABS: 1.0000 | ORAL_TABLET | Freq: Two times a day (BID) | ORAL | 0 refills | Status: DC

## 2020-08-24 NOTE — Progress Notes (Signed)
   Subjective:    Patient ID: Tamara Evans, female    DOB: 10-03-1945, 75 y.o.   MRN: [id number]  HPI Documentation for virtual audio  telecommunications through [place] encounter: She was unable to do a video visit. The patient was located at home. 2 patient identifiers used.  The provider was located in the office. The patient did consent to this visit and is aware of possible charges through their insurance for this visit. The other persons participating in this telemedicine service were none. Time spent on call was 8 minutes and in review of previous records >20 minutes total for counseling and coordination of care. This virtual service is not related to other E/M service within previous 7 days.  She states that 2 weeks ago she developed a slight sore throat after recent flight from the [facility] back to [place].  Since then she has continued to have sore throat with rhinorrhea, dry cough and now postnasal drainage as well as left earache.  She has had COVID testing x2 and both were negative she was seen in an urgent care center and said she had sinusitis but did not treated with an antibiotic.  She has been using Tylenol.  No fever, chills, shortness of breath.  No upper tooth pain.  Review of Systems     Objective:   Physical Exam Alert and her voice does sound slightly gravelly.       Assessment & Plan:   Acute sinusitis, recurrence not specified, unspecified location - Plan: amoxicillin-clavulanate (AUGMENTIN) 875-125 MG tablet Since she has had this for almost 2 weeks and no improvement, I think she does indeed have a bacterial sinus infection.  She is to use Tylenol and she can also take 2 Aleve twice per day.  She is to take all the antibiotic and if not totally back to normal when she finished, she should call me.

## 2020-11-13 ENCOUNTER — Other Ambulatory Visit: Payer: Medicare PPO

## 2020-11-14 ENCOUNTER — Other Ambulatory Visit: Payer: Medicare PPO

## 2020-11-14 ENCOUNTER — Other Ambulatory Visit: Payer: Self-pay

## 2020-11-14 DIAGNOSIS — E038 Other specified hypothyroidism: Secondary | ICD-10-CM

## 2020-11-14 DIAGNOSIS — R7309 Other abnormal glucose: Secondary | ICD-10-CM

## 2020-11-14 DIAGNOSIS — E7800 Pure hypercholesterolemia, unspecified: Secondary | ICD-10-CM | POA: Diagnosis not present

## 2020-11-14 DIAGNOSIS — E041 Nontoxic single thyroid nodule: Secondary | ICD-10-CM

## 2020-11-14 DIAGNOSIS — E7439 Other disorders of intestinal carbohydrate absorption: Secondary | ICD-10-CM

## 2020-11-14 DIAGNOSIS — H40013 Open angle with borderline findings, low risk, bilateral: Secondary | ICD-10-CM | POA: Diagnosis not present

## 2020-11-15 LAB — CBC WITH DIFFERENTIAL/PLATELET
Basophils Absolute: 0 10*3/uL (ref 0.0–0.2)
Basos: 1 %
EOS (ABSOLUTE): 0.2 10*3/uL (ref 0.0–0.4)
Eos: 3 %
Hematocrit: 46.7 % — ABNORMAL HIGH (ref 34.0–46.6)
Hemoglobin: 15.1 g/dL (ref 11.1–15.9)
Immature Grans (Abs): 0 10*3/uL (ref 0.0–0.1)
Immature Granulocytes: 0 %
Lymphocytes Absolute: 1.6 10*3/uL (ref 0.7–3.1)
Lymphs: 34 %
MCH: 25.9 pg — ABNORMAL LOW (ref 26.6–33.0)
MCHC: 32.3 g/dL (ref 31.5–35.7)
MCV: 80 fL (ref 79–97)
Monocytes Absolute: 0.5 10*3/uL (ref 0.1–0.9)
Monocytes: 10 %
Neutrophils Absolute: 2.4 10*3/uL (ref 1.4–7.0)
Neutrophils: 52 %
Platelets: 195 10*3/uL (ref 150–450)
RBC: 5.82 x10E6/uL — ABNORMAL HIGH (ref 3.77–5.28)
RDW: 13.5 % (ref 11.7–15.4)
WBC: 4.7 10*3/uL (ref 3.4–10.8)

## 2020-11-15 LAB — COMPREHENSIVE METABOLIC PANEL
ALT: 26 IU/L (ref 0–32)
AST: 24 IU/L (ref 0–40)
Albumin/Globulin Ratio: 1.7 (ref 1.2–2.2)
Albumin: 4.3 g/dL (ref 3.7–4.7)
Alkaline Phosphatase: 82 IU/L (ref 44–121)
BUN/Creatinine Ratio: 27 (ref 12–28)
BUN: 21 mg/dL (ref 8–27)
Bilirubin Total: 0.6 mg/dL (ref 0.0–1.2)
CO2: 25 mmol/L (ref 20–29)
Calcium: 9.5 mg/dL (ref 8.7–10.3)
Chloride: 104 mmol/L (ref 96–106)
Creatinine, Ser: 0.77 mg/dL (ref 0.57–1.00)
Globulin, Total: 2.6 g/dL (ref 1.5–4.5)
Glucose: 97 mg/dL (ref 70–99)
Potassium: 3.8 mmol/L (ref 3.5–5.2)
Sodium: 143 mmol/L (ref 134–144)
Total Protein: 6.9 g/dL (ref 6.0–8.5)
eGFR: 81 mL/min/{1.73_m2} (ref >59)

## 2020-11-15 LAB — LIPID PANEL
Chol/HDL Ratio: 2.8 ratio (ref 0.0–4.4)
Cholesterol, Total: 164 mg/dL (ref 100–199)
HDL: 59 mg/dL (ref >39)
LDL Chol Calc (NIH): 84 mg/dL (ref 0–99)
Triglycerides: 119 mg/dL (ref 0–149)
VLDL Cholesterol Cal: 21 mg/dL (ref 5–40)

## 2020-11-15 LAB — TSH: TSH: 0.788 u[IU]/mL (ref 0.450–4.500)

## 2020-11-16 ENCOUNTER — Ambulatory Visit: Payer: Medicare PPO | Admitting: Family Medicine

## 2020-11-18 ENCOUNTER — Other Ambulatory Visit: Payer: Self-pay | Admitting: Family Medicine

## 2020-11-18 DIAGNOSIS — E7800 Pure hypercholesterolemia, unspecified: Secondary | ICD-10-CM

## 2020-11-20 ENCOUNTER — Other Ambulatory Visit: Payer: Self-pay

## 2020-11-20 ENCOUNTER — Encounter: Payer: Self-pay | Admitting: Family Medicine

## 2020-11-20 ENCOUNTER — Ambulatory Visit (INDEPENDENT_AMBULATORY_CARE_PROVIDER_SITE_OTHER): Payer: Medicare PPO | Admitting: Family Medicine

## 2020-11-20 VITALS — BP 130/86 | HR 68 | Temp 97.4°F | Ht 63.0 in | Wt 144.8 lb

## 2020-11-20 DIAGNOSIS — Z853 Personal history of malignant neoplasm of breast: Secondary | ICD-10-CM

## 2020-11-20 DIAGNOSIS — E7800 Pure hypercholesterolemia, unspecified: Secondary | ICD-10-CM

## 2020-11-20 DIAGNOSIS — Z0000 Encounter for general adult medical examination without abnormal findings: Secondary | ICD-10-CM

## 2020-11-20 DIAGNOSIS — E041 Nontoxic single thyroid nodule: Secondary | ICD-10-CM

## 2020-11-20 DIAGNOSIS — J301 Allergic rhinitis due to pollen: Secondary | ICD-10-CM

## 2020-11-20 DIAGNOSIS — M8580 Other specified disorders of bone density and structure, unspecified site: Secondary | ICD-10-CM

## 2020-11-20 DIAGNOSIS — R7309 Other abnormal glucose: Secondary | ICD-10-CM

## 2020-11-20 DIAGNOSIS — E7439 Other disorders of intestinal carbohydrate absorption: Secondary | ICD-10-CM

## 2020-11-20 LAB — POCT GLYCOSYLATED HEMOGLOBIN (HGB A1C): Hemoglobin A1C: 5.6 % (ref 4.0–5.6)

## 2020-11-20 MED ORDER — ATORVASTATIN CALCIUM 20 MG PO TABS: 20.0000 mg | ORAL_TABLET | Freq: Every day | ORAL | 3 refills | Status: DC

## 2020-11-20 MED ORDER — LEVOTHYROXINE SODIUM 88 MCG PO TABS: 88.0000 ug | ORAL_TABLET | Freq: Every day | ORAL | 3 refills | Status: DC

## 2020-11-20 NOTE — Patient Instructions (Signed)
  Tamara Evans , Thank you for taking time to come for your Medicare Wellness Visit. I appreciate your ongoing commitment to your health goals. Please review the following plan we discussed and let me know if I can assist you in the future.   These are the goals we discussed:  Goals   None     This is a list of the screening recommended for you and due dates:  Health Maintenance  Topic Date Due   Tetanus Vaccine  02/10/2021   Mammogram  04/19/2022   Colon Cancer Screening  01/04/2024   Flu Shot  Completed   DEXA scan (bone density measurement)  Completed   COVID-19 Vaccine  Completed   Hepatitis C Screening: USPSTF Recommendation to screen - Ages 33-79 yo.  Completed   Zoster (Shingles) Vaccine  Completed   HPV Vaccine  Aged Out   Pneumonia Vaccine  Discontinued

## 2020-11-20 NOTE — Progress Notes (Signed)
Tamara Evans is a 75 y.o. female who presents for annual wellness visit,CPE and follow-up on chronic medical conditions.  She has no particular concerns or complaints.  She continues on atorvastatin and is having no difficulty with that.  Is also taking Synthroid with no problems.  Does have a previous history of breast cancer.  Recent DEXA scan showed osteopenia and she does use vitamin D and calcium supplementation.  She does have a history of glucose intolerance.  She also takes other multivitamins.  Her allergies are under good control with OTC medications.  She exercises regularly.  Does not smoke or drink.  Immunizations and Health Maintenance Immunization History  Administered Date(s) Administered   DT (Pediatric) 07/04/1994, 05/22/2004   Influenza Split 12/15/2000, 10/29/2010, 11/17/2012   Influenza, High Dose Seasonal PF 10/22/2013, 10/24/2014, 10/30/2015, 10/30/2016, 10/30/2017, 10/16/2018, 11/10/2020   Influenza-Unspecified 10/30/2016, 11/04/2019   PFIZER Comirnaty(Gray Top)Covid-19 Tri-Sucrose Vaccine 06/08/2020   PFIZER(Purple Top)SARS-COV-2 Vaccination 02/19/2019, 03/15/2019, 10/28/2019   Pfizer Covid-19 Vaccine Bivalent Booster 30yrs & up 10/24/2020   Pneumococcal Conjugate-13 10/22/2013   Pneumococcal Polysaccharide-23 09/03/2007   Tdap 02/11/2011   Zoster Recombinat (Shingrix) 08/01/2017, 12/12/2017   Zoster, Live 09/08/2007   There are no preventive care reminders to display for this patient.   Last Pap smear: aged out  Last mammogram: 04/18/20 Last colonoscopy:  01/03/14 Last DEXA: 01/1820  Dentist: Q five months  Ophtho: Q year  Exercise: walking 7 seven days a week   Other doctors caring for patient include: N/A  Advanced directives: Does Patient Have a Medical Advance Directive?: Yes Type of Advance Directive: Living will, Out of facility DNR (pink MOST or yellow form) Does patient want to make changes to medical advance directive?: No - Patient  declined  Depression screen:  See questionnaire below.  Depression screen [facility] 2/9 11/20/2020 11/11/2019 11/10/2018 11/04/2017 11/05/2016  Decreased Interest 0 0 0 0 0  Down, Depressed, Hopeless 0 0 0 0 1  PHQ - 2 Score 0 0 0 0 1    Fall Risk Screen: see questionnaire below. Fall Risk  11/20/2020 11/11/2019 11/10/2018 11/04/2017 11/05/2016  Falls in the past year? 0 0 0 No No  Number falls in past yr: 0 - - - -  Injury with Fall? 0 - - - -  Risk for fall due to : No Fall Risks - - - -  Follow up Falls evaluation completed - - - -    ADL screen:  See questionnaire below Functional Status Survey: Is the patient deaf or have difficulty hearing?: No Does the patient have difficulty seeing, even when wearing glasses/contacts?: No Does the patient have difficulty concentrating, remembering, or making decisions?: No Does the patient have difficulty walking or climbing stairs?: No Does the patient have difficulty dressing or bathing?: No Does the patient have difficulty doing errands alone such as visiting a doctor's office or shopping?: No   Review of Systems Constitutional: -, -unexpected weight change, -anorexia, -fatigue Allergy: -sneezing, -itching, -congestion Dermatology: denies changing moles, rash, lumps ENT: -runny nose, -ear pain, -sore throat,  Cardiology:  -chest pain, -palpitations, -orthopnea, Respiratory: -cough, -shortness of breath, -dyspnea on exertion, -wheezing,  Gastroenterology: -abdominal pain, -nausea, -vomiting, -diarrhea, -constipation, -dysphagia Hematology: -bleeding or bruising problems Musculoskeletal: -arthralgias, -myalgias, -joint swelling, -back pain, - Ophthalmology: -vision changes,  Urology: -dysuria, -difficulty urinating,  -urinary frequency, -urgency, incontinence Neurology: -, -numbness, , -memory loss, -falls, -dizziness    PHYSICAL EXAM:   General Appearance: Alert, cooperative, no distress, appears stated age  Head: Normocephalic, without  obvious abnormality, atraumatic Eyes: PERRL, conjunctiva/corneas clear, EOM's intact,  Ears: Normal TM's and external ear canals Nose: Nares normal, mucosa normal, no drainage or sinus tenderness Throat: Lips, mucosa, and tongue normal; teeth and gums normal Neck: Supple, no lymphadenopathy;  thyroid:  no enlargement/tenderness/nodules; no carotid bruit or JVD Lungs: Clear to auscultation bilaterally without wheezes, rales or ronchi; respirations unlabored Heart: Regular rate and rhythm, S1 and S2 normal, no murmur, rubor gallop Abdomen: Soft, non-tender, nondistended, normoactive bowel sounds,  no masses, no hepatosplenomegaly  Skin:  Skin color, texture, turgor normal, no rashes or lesions Lymph nodes: Cervical, supraclavicular, and axillary nodes normal Neurologic:  CNII-XII intact, normal strength, sensation and gait; reflexes 2+ and symmetric throughout Psych: Normal mood, affect, hygiene and grooming. Hemoglobin A1c is 5.6 ASSESSMENT/PLAN: Routine general medical examination at a health care facility  Seasonal allergic rhinitis due to pollen  Thyroid nodule - Plan: levothyroxine (SYNTHROID) 88 MCG tablet  Osteopenia, unspecified location  Glucose intolerance - Plan: POCT glycosylated hemoglobin (Hb A1C)  History of breast cancer  Pure hypercholesterolemia - Plan: atorvastatin (LIPITOR) 20 MG tablet  Recent blood work and DEXA scan was reviewed with the patient.  Discussed yearly mammograms; at least 30 minutes of aerobic activity at least 5 days/week and weight-bearing exercise 2x/week; healthy diet, including goals of calcium and vitamin D intake Immunization recommendations discussed.  Colonoscopy recommendations reviewed   Medicare Attestation I have personally reviewed: The patient's medical and social history Their use of alcohol, tobacco or illicit drugs Their current medications and supplements The patient's functional ability including ADLs,fall risks, home  safety risks, cognitive, and hearing and visual impairment Diet and physical activities Evidence for depression or mood disorders  The patient's weight, height, and BMI have been recorded in the chart.  I have made referrals, counseling, and provided education to the patient based on review of the above and I have provided the patient with a written personalized care plan for preventive services.     Jill Alexanders, MD   11/20/2020

## 2021-03-06 ENCOUNTER — Other Ambulatory Visit: Payer: Self-pay | Admitting: Family Medicine

## 2021-03-06 DIAGNOSIS — Z1231 Encounter for screening mammogram for malignant neoplasm of breast: Secondary | ICD-10-CM

## 2021-04-19 ENCOUNTER — Ambulatory Visit
Admission: RE | Admit: 2021-04-19 | Discharge: 2021-04-19 | Disposition: A | Discharge status: Home / Self Care | Payer: Medicare PPO | Source: Ambulatory Visit | Attending: Family Medicine | Admitting: Family Medicine

## 2021-04-19 DIAGNOSIS — Z1231 Encounter for screening mammogram for malignant neoplasm of breast: Secondary | ICD-10-CM | POA: Diagnosis not present

## 2021-07-19 DIAGNOSIS — Z853 Personal history of malignant neoplasm of breast: Secondary | ICD-10-CM | POA: Diagnosis not present

## 2021-07-19 DIAGNOSIS — E785 Hyperlipidemia, unspecified: Secondary | ICD-10-CM | POA: Diagnosis not present

## 2021-07-19 DIAGNOSIS — Z833 Family history of diabetes mellitus: Secondary | ICD-10-CM | POA: Diagnosis not present

## 2021-07-19 DIAGNOSIS — Z885 Allergy status to narcotic agent status: Secondary | ICD-10-CM | POA: Diagnosis not present

## 2021-07-19 DIAGNOSIS — Z8249 Family history of ischemic heart disease and other diseases of the circulatory system: Secondary | ICD-10-CM | POA: Diagnosis not present

## 2021-07-19 DIAGNOSIS — E039 Hypothyroidism, unspecified: Secondary | ICD-10-CM | POA: Diagnosis not present

## 2021-07-19 DIAGNOSIS — R030 Elevated blood-pressure reading, without diagnosis of hypertension: Secondary | ICD-10-CM | POA: Diagnosis not present

## 2021-07-19 DIAGNOSIS — Z87892 Personal history of anaphylaxis: Secondary | ICD-10-CM | POA: Diagnosis not present

## 2021-07-19 DIAGNOSIS — Z604 Social exclusion and rejection: Secondary | ICD-10-CM | POA: Diagnosis not present

## 2021-09-17 ENCOUNTER — Encounter: Payer: Self-pay | Admitting: Family Medicine

## 2021-10-03 ENCOUNTER — Encounter: Payer: Self-pay | Admitting: Internal Medicine

## 2021-10-18 ENCOUNTER — Telehealth: Payer: Self-pay | Admitting: Family Medicine

## 2021-10-18 NOTE — Telephone Encounter (Signed)
Left message for patient to call back and schedule Medicare Annual Wellness Visit (AWV) either virtually or in office. I left my number for patient to call [phone].  Last AWV ;11/20/20  please schedule at anytime with health coach  I wanted to see if patient would schedule awv/[place] prior to pcp appt

## 2021-10-19 ENCOUNTER — Telehealth: Payer: Self-pay | Admitting: Family Medicine

## 2021-10-19 DIAGNOSIS — M8580 Other specified disorders of bone density and structure, unspecified site: Secondary | ICD-10-CM

## 2021-10-19 DIAGNOSIS — J301 Allergic rhinitis due to pollen: Secondary | ICD-10-CM

## 2021-10-19 DIAGNOSIS — E7800 Pure hypercholesterolemia, unspecified: Secondary | ICD-10-CM

## 2021-10-19 DIAGNOSIS — E7439 Other disorders of intestinal carbohydrate absorption: Secondary | ICD-10-CM

## 2021-10-19 DIAGNOSIS — E041 Nontoxic single thyroid nodule: Secondary | ICD-10-CM

## 2021-10-19 NOTE — Telephone Encounter (Signed)
Spoke with patient to schedule awv 11/30/21.  She wanted to know if she could have labs done prior to her 12/05/21 appt w/Dr Redmond [employer].

## 2021-11-19 ENCOUNTER — Encounter: Payer: Self-pay | Admitting: Internal Medicine

## 2021-11-27 ENCOUNTER — Encounter: Payer: Medicare PPO | Admitting: Family Medicine

## 2021-11-29 ENCOUNTER — Other Ambulatory Visit: Payer: Self-pay | Admitting: Family Medicine

## 2021-11-29 DIAGNOSIS — E041 Nontoxic single thyroid nodule: Secondary | ICD-10-CM

## 2021-11-30 ENCOUNTER — Ambulatory Visit (INDEPENDENT_AMBULATORY_CARE_PROVIDER_SITE_OTHER): Payer: Medicare PPO

## 2021-11-30 VITALS — Ht 63.0 in | Wt 142.0 lb

## 2021-11-30 DIAGNOSIS — Z0000 Encounter for general adult medical examination without abnormal findings: Secondary | ICD-10-CM | POA: Diagnosis not present

## 2021-11-30 NOTE — Patient Instructions (Signed)
Tamara Evans , Thank you for taking time to come for your Medicare Wellness Visit. I appreciate your ongoing commitment to your health goals. Please review the following plan we discussed and let me know if I can assist you in the future.   Screening recommendations/referrals: Colonoscopy: completed 01/03/2014, due 01/04/2024 Mammogram: completed 04/19/2021, due 04/21/2022 Bone Density: completed 02/15/2020 Recommended yearly ophthalmology/optometry visit for glaucoma screening and checkup Recommended yearly dental visit for hygiene and checkup  Vaccinations: Influenza vaccine: completed 10/08/2021 Pneumococcal vaccine: completed 10/22/2013 Tdap vaccine: completed 04/19/2021, due 04/20/2031 Shingles vaccine: completed   Covid-19: 10/29/2021, 10/24/2020, 06/08/2020, 10/28/2019, 03/15/2019, 02/19/2019  Advanced directives: Please bring a copy of your POA (Power of Attorney) and/or Living Will to your next appointment.   Conditions/risks identified: none  Next appointment: Follow up in one year for your annual wellness visit    Preventive Care 65 Years and Older, Female Preventive care refers to lifestyle choices and visits with your health care provider that can promote health and wellness. What does preventive care include? A yearly physical exam. This is also called an annual well check. Dental exams once or twice a year. Routine eye exams. Ask your health care provider how often you should have your eyes checked. Personal lifestyle choices, including: Daily care of your teeth and gums. Regular physical activity. Eating a healthy diet. Avoiding tobacco and drug use. Limiting alcohol use. Practicing safe sex. Taking low-dose aspirin every day. Taking vitamin and mineral supplements as recommended by your health care provider. What happens during an annual well check? The services and screenings done by your health care provider during your annual well check will depend on your age, overall  health, lifestyle risk factors, and family history of disease. Counseling  Your health care provider may ask you questions about your: Alcohol use. Tobacco use. Drug use. Emotional well-being. Home and relationship well-being. Sexual activity. Eating habits. History of falls. Memory and ability to understand (cognition). Work and work Statistician. Reproductive health. Screening  You may have the following tests or measurements: Height, weight, and BMI. Blood pressure. Lipid and cholesterol levels. These may be checked every 5 years, or more frequently if you are over 30 years old. Skin check. Lung cancer screening. You may have this screening every year starting at age 75 if you have a 30-pack-year history of smoking and currently smoke or have quit within the past 15 years. Fecal occult blood test (FOBT) of the stool. You may have this test every year starting at age 20. Flexible sigmoidoscopy or colonoscopy. You may have a sigmoidoscopy every 5 years or a colonoscopy every 10 years starting at age 44. Hepatitis C blood test. Hepatitis B blood test. Sexually transmitted disease (STD) testing. Diabetes screening. This is done by checking your blood sugar (glucose) after you have not eaten for a while (fasting). You may have this done every 1-3 years. Bone density scan. This is done to screen for osteoporosis. You may have this done starting at age 75. Mammogram. This may be done every 1-2 years. Talk to your health care provider about how often you should have regular mammograms. Talk with your health care provider about your test results, treatment options, and if necessary, the need for more tests. Vaccines  Your health care provider may recommend certain vaccines, such as: Influenza vaccine. This is recommended every year. Tetanus, diphtheria, and acellular pertussis (Tdap, Td) vaccine. You may need a Td booster every 10 years. Zoster vaccine. You may need this after age  75. Pneumococcal 13-valent conjugate (PCV13) vaccine. One dose is recommended after age 75. Pneumococcal polysaccharide (PPSV23) vaccine. One dose is recommended after age 75. Talk to your health care provider about which screenings and vaccines you need and how often you need them. This information is not intended to replace advice given to you by your health care provider. Make sure you discuss any questions you have with your health care provider. Document Released: 02/10/2015 Document Revised: 10/04/2015 Document Reviewed: 11/15/2014 Elsevier Interactive Patient Education  2017 [place] Prevention in the Home Falls can cause injuries. They can happen to people of all ages. There are many things you can do to make your home safe and to help prevent falls. What can I do on the outside of my home? Regularly fix the edges of walkways and driveways and fix any cracks. Remove anything that might make you trip as you walk through a door, such as a raised step or threshold. Trim any bushes or trees on the path to your home. Use bright outdoor lighting. Clear any walking paths of anything that might make someone trip, such as rocks or tools. Regularly check to see if handrails are loose or broken. Make sure that both sides of any steps have handrails. Any raised decks and porches should have guardrails on the edges. Have any leaves, snow, or ice cleared regularly. Use sand or salt on walking paths during winter. Clean up any spills in your garage right away. This includes oil or grease spills. What can I do in the bathroom? Use night lights. Install grab bars by the toilet and in the tub and shower. Do not use towel bars as grab bars. Use non-skid mats or decals in the tub or shower. If you need to sit down in the shower, use a plastic, non-slip stool. Keep the floor dry. Clean up any water that spills on the floor as soon as it happens. Remove soap buildup in the tub or shower  regularly. Attach bath mats securely with double-sided non-slip rug tape. Do not have throw rugs and other things on the floor that can make you trip. What can I do in the bedroom? Use night lights. Make sure that you have a light by your bed that is easy to reach. Do not use any sheets or blankets that are too big for your bed. They should not hang down onto the floor. Have a firm chair that has side arms. You can use this for support while you get dressed. Do not have throw rugs and other things on the floor that can make you trip. What can I do in the kitchen? Clean up any spills right away. Avoid walking on wet floors. Keep items that you use a lot in easy-to-reach places. If you need to reach something above you, use a strong step stool that has a grab bar. Keep electrical cords out of the way. Do not use floor polish or wax that makes floors slippery. If you must use wax, use non-skid floor wax. Do not have throw rugs and other things on the floor that can make you trip. What can I do with my stairs? Do not leave any items on the stairs. Make sure that there are handrails on both sides of the stairs and use them. Fix handrails that are broken or loose. Make sure that handrails are as long as the stairways. Check any carpeting to make sure that it is firmly attached to the stairs. Fix any  carpet that is loose or worn. Avoid having throw rugs at the top or bottom of the stairs. If you do have throw rugs, attach them to the floor with carpet tape. Make sure that you have a light switch at the top of the stairs and the bottom of the stairs. If you do not have them, ask someone to add them for you. What else can I do to help prevent falls? Wear shoes that: Do not have high heels. Have rubber bottoms. Are comfortable and fit you well. Are closed at the toe. Do not wear sandals. If you use a stepladder: Make sure that it is fully opened. Do not climb a closed stepladder. Make sure that  both sides of the stepladder are locked into place. Ask someone to hold it for you, if possible. Clearly mark and make sure that you can see: Any grab bars or handrails. First and last steps. Where the edge of each step is. Use tools that help you move around (mobility aids) if they are needed. These include: Canes. Walkers. Scooters. Crutches. Turn on the lights when you go into a dark area. Replace any light bulbs as soon as they burn out. Set up your furniture so you have a clear path. Avoid moving your furniture around. If any of your floors are uneven, fix them. If there are any pets around you, be aware of where they are. Review your medicines with your doctor. Some medicines can make you feel dizzy. This can increase your chance of falling. Ask your doctor what other things that you can do to help prevent falls. This information is not intended to replace advice given to you by your health care provider. Make sure you discuss any questions you have with your health care provider. Document Released: 11/10/2008 Document Revised: 06/22/2015 Document Reviewed: 02/18/2014 Elsevier Interactive Patient Education  2017 [employer].

## 2021-11-30 NOTE — Progress Notes (Signed)
I connected with Tamara Evans today by telephone and verified that I am speaking with the correct person using two identifiers. Location patient: home Location provider: work Persons participating in the virtual visit: Tamara Evans, Glenna Durand LPN.   I discussed the limitations, risks, security and privacy concerns of performing an evaluation and management service by telephone and the availability of in person appointments. I also discussed with the patient that there may be a patient responsible charge related to this service. The patient expressed understanding and verbally consented to this telephonic visit.    Interactive audio and video telecommunications were attempted between this provider and patient, however failed, due to patient having technical difficulties OR patient did not have access to video capability.  We continued and completed visit with audio only.     Vital signs may be patient reported or missing.  Subjective:   Tamara Evans is a 76 y.o. female who presents for Medicare Annual (Subsequent) preventive examination.  Review of Systems     Cardiac Risk Factors include: advanced age (>23mn, >>63women)     Objective:    Today's Vitals   11/30/21 1557  Weight: 142 lb (64.4 kg)  Height: '5\' 3"'$  (1.6 m)   Body mass index is 25.15 kg/m.     11/30/2021    4:04 PM 11/20/2020    9:31 AM 11/11/2019    9:28 AM 11/10/2018    1:09 PM 11/04/2017    1:48 PM 11/05/2016   10:50 AM 01/10/2016    2:39 PM  Advanced Directives  Does Patient Have a Medical Advance Directive? Yes Yes Yes No Yes Yes No  Type of AParamedicof [place] will Living will;Out of facility DNR (pink MOST or yellow form) Living will;Healthcare Power of ALaurel RunOut of facility DNR (pink MOST or yellow form)  [place] will    Does patient want to make changes to medical advance directive?  No - Patient declined No - Patient declined  No -  Patient declined No - Patient declined   Copy of H[place] Chart? No - copy requested  No - copy requested      Would patient like information on creating a medical advance directive?   No - Patient declined Yes (MAU/Ambulatory/Procedural Areas - Information given)       Current Medications (verified) Outpatient Encounter Medications as of 11/30/2021  Medication Sig   atorvastatin (LIPITOR) 20 MG tablet Take 1 tablet (20 mg total) by mouth daily.   calcium carbonate (OS-CAL) 600 MG TABS Take 600 mg by mouth 1 day or 1 dose.   levothyroxine (SYNTHROID) 88 MCG tablet TAKE 1 TABLET BY MOUTH EVERY DAY   Multiple Vitamin (MULTIVITAMIN) tablet Take 1 tablet by mouth daily.   fish oil-omega-3 fatty acids 1000 MG capsule Take 1 g by mouth daily. (Patient not taking: Reported on 11/30/2021)   [DISCONTINUED] amoxicillin-clavulanate (AUGMENTIN) 875-125 MG tablet Take 1 tablet by mouth 2 (two) times daily. (Patient not taking: Reported on 11/20/2020)   [DISCONTINUED] sulfamethoxazole-trimethoprim (BACTRIM DS) 800-160 MG tablet Take 1 tablet by mouth 2 (two) times daily. (Patient not taking: No sig reported)   No facility-administered encounter medications on file as of 11/30/2021.    Allergies (verified) Codeine   History: Past Medical History:  Diagnosis Date   Allergy    perennial   [place]   right breast stage I, er/pr positive, invasive ductal CA. Treated with lumpectomy and radiation  Fibrocystic breast disease    Goiter, nodular 05/06   managed by Dr. Wilson Singer   Hx of radiation therapy    right breast   Hyperlipidemia    Menopause    Multinodular goiter    Osteopenia 6/07   L-spine   Personal history of radiation therapy    PMS (premenstrual syndrome)    Past Surgical History:  Procedure Laterality Date   BREAST BIOPSY Left 09/18/2010   benign   BREAST BIOPSY Right 09/18/2010   BREAST LUMPECTOMY  10/05/2010   lumpectomy and sentinel node  biopsy (Right)   CERVICAL POLYPECTOMY     in the patient's 20's   Family History  Problem Relation Age of Onset   Dementia Mother    Osteoporosis Mother    Heart disease Mother        pacemaker   Diabetes Mother    Hypertension Father    Hyperlipidemia Father    Hyperlipidemia Brother    Cancer Maternal Grandfather        48's, ? type (smoker)   Diabetes Maternal Grandfather    Cancer Paternal Grandfather    Thyroid disease Paternal Grandmother        goiter   Colon cancer Neg Hx    Social History   Socioeconomic History   Marital status: Single    Spouse name: Not on file   Number of children: 0   Years of education: Not on file   Highest education level: Not on file  Occupational History   Occupation: Tree surgeon: [employer]  Tobacco Use   Smoking status: Never   Smokeless tobacco: Never  Vaping Use   Vaping Use: Never used  Substance and Sexual Activity   Alcohol use: Yes    Alcohol/week: 0.0 standard drinks of alcohol    Comment: 3-4 glasses wine per month.   Drug use: No   Sexual activity: Not Currently  Other Topics Concern   Not on file  Social History Narrative   Lives alone, 1 cat   Social Determinants of Health   Financial Resource Strain: Low Risk  (11/30/2021)   Overall Financial Resource Strain (CARDIA)    Difficulty of Paying Living Expenses: Not hard at all  Food Insecurity: No Food Insecurity (11/30/2021)   Hunger Vital Sign    Worried About Running Out of Food in the Last Year: Never true    Ran Out of Food in the Last Year: Never true  Transportation Needs: No Transportation Needs (11/30/2021)   PRAPARE - Hydrologist (Medical): No    Lack of Transportation (Non-Medical): No  Physical Activity: Sufficiently Active (11/30/2021)   Exercise Vital Sign    Days of Exercise per Week: 2 days    Minutes of Exercise per Session: 90 min  Stress: No Stress Concern Present (11/30/2021)   [place]    Feeling of Stress : Not at all  Social Connections: Not on file    Tobacco Counseling Counseling given: Not Answered   Clinical Intake:  Pre-visit preparation completed: Yes  Pain : No/denies pain     Nutritional Status: BMI 25 -29 Overweight Nutritional Risks: Nausea/ vomitting/ diarrhea (a little diarrhea, has resolved) Diabetes: No  How often do you need to have someone help you when you read instructions, pamphlets, or other written materials from your doctor or pharmacy?: 1 - Never What is the last grade level you completed in  school?: graduate degree  Diabetic? no  Interpreter Needed?: No  Information entered by :: NAllen LPN   Activities of Daily Living    11/30/2021    4:06 PM  In your present state of health, do you have any difficulty performing the following activities:  Hearing? 0  Vision? 0  Difficulty concentrating or making decisions? 0  Walking or climbing stairs? 0  Dressing or bathing? 0  Doing errands, shopping? 0  Preparing Food and eating ? N  Using the Toilet? N  In the past six months, have you accidently leaked urine? N  Do you have problems with loss of bowel control? N  Managing your Medications? N  Managing your Finances? N  Housekeeping or managing your Housekeeping? N    Patient Care Team: Denita Lung, MD as PCP - General (Family Medicine)  Indicate any recent Medical Services you may have received from other than [facility] providers in the past year (date may be approximate).     Assessment:   This is a routine wellness examination for [place].  Hearing/Vision screen Vision Screening - Comments:: Regular eye exams, [facility]  Dietary issues and exercise activities discussed: Current Exercise Habits: Home exercise routine, Type of exercise: Other - see comments (hiking), Time (Minutes): > 60, Frequency (Times/Week): 2, Weekly Exercise (Minutes/Week): 0   Goals  Addressed             This Visit's Progress    Patient Stated       11/30/2021, no goals       Depression Screen    11/30/2021    4:05 PM 11/20/2020    9:33 AM 11/11/2019    9:30 AM 11/10/2018    9:44 AM 11/04/2017    1:22 PM 11/05/2016   10:41 AM 11/02/2015    2:38 PM  PHQ 2/9 Scores  PHQ - 2 Score 0 0 0 0 0 1 0    Fall Risk    11/30/2021    4:05 PM 11/20/2020    9:33 AM 11/11/2019    9:29 AM 11/10/2018    9:44 AM 11/04/2017    1:22 PM  Fall Risk   Falls in the past year? 0 0 0 0 No  Number falls in past yr: 0 0     Injury with Fall? 0 0     Risk for fall due to : No Fall Risks;Medication side effect No Fall Risks     Follow up Falls prevention discussed;Education provided;Falls evaluation completed Falls evaluation completed       FALL RISK PREVENTION PERTAINING TO THE HOME:  Any stairs in or around the home? Yes  If so, are there any without handrails? No  Home free of loose throw rugs in walkways, pet beds, electrical cords, etc? Yes  Adequate lighting in your home to reduce risk of falls? Yes   ASSISTIVE DEVICES UTILIZED TO PREVENT FALLS:  Life alert? No  Use of a cane, walker or w/c? No  Grab bars in the bathroom? Yes  Shower chair or bench in shower? Yes  Elevated toilet seat or a handicapped toilet? Yes   TIMED UP AND GO:  Was the test performed? No .      Cognitive Function:        11/30/2021    4:08 PM  6CIT Screen  What Year? 0 points  What month? 0 points  What time? 0 points  Count back from 20 0 points  Months in reverse 0 points  Repeat phrase 0 points  Total Score 0 points    Immunizations Immunization History  Administered Date(s) Administered   DT (Pediatric) 07/04/1994, 05/22/2004   Fluad Quad(high Dose 65+) 10/08/2021   Influenza Split 12/15/2000, 10/29/2010, 11/17/2012   Influenza, High Dose Seasonal PF 10/22/2013, 10/24/2014, 10/30/2015, 10/30/2016, 10/30/2017, 10/16/2018, 11/10/2020   Influenza-Unspecified  10/30/2016, 11/04/2019   PFIZER Comirnaty(Gray Top)Covid-19 Tri-Sucrose Vaccine 06/08/2020   PFIZER(Purple Top)SARS-COV-2 Vaccination 02/19/2019, 03/15/2019, 10/28/2019   Pfizer Covid-19 Vaccine Bivalent Booster 75yr & up 10/24/2020   Pneumococcal Conjugate-13 10/22/2013   Pneumococcal Polysaccharide-23 09/03/2007   Tdap 02/11/2011, 04/19/2021   Unspecified SARS-COV-2 Vaccination 10/29/2021   Zoster Recombinat (Shingrix) 08/01/2017, 12/12/2017   Zoster, Live 09/08/2007    TDAP status: Up to date  Flu Vaccine status: Up to date  Pneumococcal vaccine status: Up to date  Covid-19 vaccine status: Completed vaccines  Qualifies for Shingles Vaccine? Yes   Zostavax completed Yes   Shingrix Completed?: Yes  Screening Tests Health Maintenance  Topic Date Due   Medicare Annual Wellness (AWV)  11/20/2021   COLONOSCOPY (Pts 45-474yrInsurance coverage will need to be confirmed)  01/04/2024   TETANUS/TDAP  04/20/2031   INFLUENZA VACCINE  Completed   DEXA SCAN  Completed   COVID-19 Vaccine  Completed   Hepatitis C Screening  Completed   Zoster Vaccines- Shingrix  Completed   HPV VACCINES  Aged Out   Pneumonia Vaccine 6566Years old  Discontinued    Health Maintenance  Health Maintenance Due  Topic Date Due   Medicare Annual Wellness (AWV)  11/20/2021    Colorectal cancer screening: Type of screening: Colonoscopy. Completed 01/03/2014. Repeat every 10 years  Mammogram status: Completed 04/19/2021. Repeat every year  Bone Density status: Completed 02/15/2020.   Lung Cancer Screening: (Low Dose CT Chest recommended if Age 75-80ears, 30 pack-year currently smoking OR have quit w/in 15years.) does not qualify.   Lung Cancer Screening Referral: no  Additional Screening:  Hepatitis C Screening: does qualify; Completed 11/02/2015  Vision Screening: Recommended annual ophthalmology exams for early detection of glaucoma and other disorders of the eye. Is the patient up to date with  their annual eye exam?  Yes  Who is the provider or what is the name of the office in which the patient attends annual eye exams? [facility] If pt is not established with a provider, would they like to be referred to a provider to establish care? No .   Dental Screening: Recommended annual dental exams for proper oral hygiene  Community Resource Referral / Chronic Care Management: CRR required this visit?  No   CCM required this visit?  No      Plan:     I have personally reviewed and noted the following in the patient's chart:   Medical and social history Use of alcohol, tobacco or illicit drugs  Current medications and supplements including opioid prescriptions. Patient is not currently taking opioid prescriptions. Functional ability and status Nutritional status Physical activity Advanced directives List of other physicians Hospitalizations, surgeries, and ER visits in previous 12 months Vitals Screenings to include cognitive, depression, and falls Referrals and appointments  In addition, I have reviewed and discussed with patient certain preventive protocols, quality metrics, and best practice recommendations. A written personalized care plan for preventive services as well as general preventive health recommendations were provided to patient.     NiKellie SimmeringLPN   [id number] Nurse Notes: none   Due to this being a virtual  visit, the after visit summary with patients personalized plan was offered to patient via mail or my-chart.  Patient would like to access on my-chart

## 2021-12-03 ENCOUNTER — Other Ambulatory Visit: Payer: Medicare PPO

## 2021-12-03 DIAGNOSIS — M8580 Other specified disorders of bone density and structure, unspecified site: Secondary | ICD-10-CM | POA: Diagnosis not present

## 2021-12-03 DIAGNOSIS — E7800 Pure hypercholesterolemia, unspecified: Secondary | ICD-10-CM

## 2021-12-03 DIAGNOSIS — J301 Allergic rhinitis due to pollen: Secondary | ICD-10-CM

## 2021-12-03 DIAGNOSIS — E041 Nontoxic single thyroid nodule: Secondary | ICD-10-CM

## 2021-12-04 LAB — COMPREHENSIVE METABOLIC PANEL
ALT: 36 IU/L — ABNORMAL HIGH (ref 0–32)
AST: 23 IU/L (ref 0–40)
Albumin/Globulin Ratio: 1.8 (ref 1.2–2.2)
Albumin: 4.2 g/dL (ref 3.8–4.8)
Alkaline Phosphatase: 97 IU/L (ref 44–121)
BUN/Creatinine Ratio: 22 (ref 12–28)
BUN: 22 mg/dL (ref 8–27)
Bilirubin Total: 0.6 mg/dL (ref 0.0–1.2)
CO2: 24 mmol/L (ref 20–29)
Calcium: 9.8 mg/dL (ref 8.7–10.3)
Chloride: 107 mmol/L — ABNORMAL HIGH (ref 96–106)
Creatinine, Ser: 1 mg/dL (ref 0.57–1.00)
Globulin, Total: 2.4 g/dL (ref 1.5–4.5)
Glucose: 97 mg/dL (ref 70–99)
Potassium: 4.8 mmol/L (ref 3.5–5.2)
Sodium: 147 mmol/L — ABNORMAL HIGH (ref 134–144)
Total Protein: 6.6 g/dL (ref 6.0–8.5)
eGFR: 59 mL/min/{1.73_m2} — ABNORMAL LOW (ref >59)

## 2021-12-04 LAB — CBC WITH DIFFERENTIAL/PLATELET
Basophils Absolute: 0 10*3/uL (ref 0.0–0.2)
Basos: 0 %
EOS (ABSOLUTE): 0.1 10*3/uL (ref 0.0–0.4)
Eos: 2 %
Hematocrit: 48.4 % — ABNORMAL HIGH (ref 34.0–46.6)
Hemoglobin: 15.3 g/dL (ref 11.1–15.9)
Immature Grans (Abs): 0 10*3/uL (ref 0.0–0.1)
Immature Granulocytes: 0 %
Lymphocytes Absolute: 1.7 10*3/uL (ref 0.7–3.1)
Lymphs: 26 %
MCH: 25.5 pg — ABNORMAL LOW (ref 26.6–33.0)
MCHC: 31.6 g/dL (ref 31.5–35.7)
MCV: 81 fL (ref 79–97)
Monocytes Absolute: 0.6 10*3/uL (ref 0.1–0.9)
Monocytes: 9 %
Neutrophils Absolute: 4.2 10*3/uL (ref 1.4–7.0)
Neutrophils: 63 %
Platelets: 291 10*3/uL (ref 150–450)
RBC: 5.99 x10E6/uL — ABNORMAL HIGH (ref 3.77–5.28)
RDW: 14.6 % (ref 11.7–15.4)
WBC: 6.7 10*3/uL (ref 3.4–10.8)

## 2021-12-04 LAB — LIPID PANEL
Chol/HDL Ratio: 2.5 ratio (ref 0.0–4.4)
Cholesterol, Total: 155 mg/dL (ref 100–199)
HDL: 62 mg/dL (ref >39)
LDL Chol Calc (NIH): 77 mg/dL (ref 0–99)
Triglycerides: 82 mg/dL (ref 0–149)
VLDL Cholesterol Cal: 16 mg/dL (ref 5–40)

## 2021-12-04 LAB — TSH: TSH: 3.27 u[IU]/mL (ref 0.450–4.500)

## 2021-12-05 ENCOUNTER — Ambulatory Visit (INDEPENDENT_AMBULATORY_CARE_PROVIDER_SITE_OTHER): Payer: Medicare PPO | Admitting: Family Medicine

## 2021-12-05 VITALS — BP 128/70 | HR 64 | Ht 64.0 in | Wt 145.8 lb

## 2021-12-05 DIAGNOSIS — Z853 Personal history of malignant neoplasm of breast: Secondary | ICD-10-CM

## 2021-12-05 DIAGNOSIS — Z0000 Encounter for general adult medical examination without abnormal findings: Secondary | ICD-10-CM

## 2021-12-05 DIAGNOSIS — M8580 Other specified disorders of bone density and structure, unspecified site: Secondary | ICD-10-CM | POA: Diagnosis not present

## 2021-12-05 DIAGNOSIS — E7800 Pure hypercholesterolemia, unspecified: Secondary | ICD-10-CM | POA: Diagnosis not present

## 2021-12-05 DIAGNOSIS — Z923 Personal history of irradiation: Secondary | ICD-10-CM

## 2021-12-05 DIAGNOSIS — E041 Nontoxic single thyroid nodule: Secondary | ICD-10-CM | POA: Diagnosis not present

## 2021-12-05 DIAGNOSIS — J40 Bronchitis, not specified as acute or chronic: Secondary | ICD-10-CM

## 2021-12-05 DIAGNOSIS — E7439 Other disorders of intestinal carbohydrate absorption: Secondary | ICD-10-CM | POA: Diagnosis not present

## 2021-12-05 DIAGNOSIS — J301 Allergic rhinitis due to pollen: Secondary | ICD-10-CM | POA: Diagnosis not present

## 2021-12-05 MED ORDER — LEVOTHYROXINE SODIUM 88 MCG PO TABS
88.0000 ug | ORAL_TABLET | Freq: Every day | ORAL | 3 refills | Status: DC
Start: 2021-12-05 — End: 2022-12-09

## 2021-12-05 MED ORDER — AZITHROMYCIN 500 MG PO TABS: 500.0000 mg | ORAL_TABLET | Freq: Every day | ORAL | 0 refills | Status: DC

## 2021-12-05 MED ORDER — ATORVASTATIN CALCIUM 20 MG PO TABS
20.0000 mg | ORAL_TABLET | Freq: Every day | ORAL | 3 refills | Status: DC
Start: 2021-12-05 — End: 2022-12-17

## 2021-12-05 NOTE — Progress Notes (Signed)
   Subjective:    Patient ID: Tamara Evans, female    DOB: 05-Feb-1945, 76 y.o.   MRN: [id number]  HPI She is here for complete examination.  She recently returned from a trip to [place] and came back with cough and congestion as well as postnasal drainage.  She states that this usually occurs every time she travels.  This has been going on for 3 weeks but no sore throat fever, chills.  She does have underlying allergies usually in the spring and the fall.  She continues on her Synthroid for the nodule.  Does have a history of osteopenia and is in need of another DEXA within the next several months.  She gets yearly mammograms as follow-up on her breast cancer.  She continues on Lipitor for her hyperlipidemia.  Her immunizations are up-to-date.  Otherwise family and social history as well as health maintenance and immunizations was reviewed.  She is retired and enjoying her retirement.   Review of Systems  All other systems reviewed and are negative.      Objective:   Physical Exam Alert and in no distress. Tympanic membranes and canals are normal. Pharyngeal area is normal. Neck is supple without adenopathy or thyromegaly. Cardiac exam shows a regular sinus rhythm without murmurs or gallops. Lungs are clear to auscultation.        Assessment & Plan:  Routine general medical examination at a health care facility  Seasonal allergic rhinitis due to pollen  Osteopenia, unspecified location - Plan: DG Bone Density  Glucose intolerance  History of breast cancer - 2012  Hx of radiation therapy  Pure hypercholesterolemia - Plan: atorvastatin (LIPITOR) 20 MG tablet  Bronchitis - Plan: azithromycin (ZITHROMAX) 500 MG tablet  Thyroid nodule - Plan: levothyroxine (SYNTHROID) 88 MCG tablet Continue on present medication regimen.  I will give her azithromycin to help with presumed atypical bronchitis.  Continue to treat allergies as needed.  No evidence of diabetes at the present time.

## 2021-12-18 ENCOUNTER — Telehealth: Payer: Self-pay | Admitting: Family Medicine

## 2021-12-18 ENCOUNTER — Other Ambulatory Visit: Payer: Self-pay | Admitting: Family Medicine

## 2021-12-18 DIAGNOSIS — Z1231 Encounter for screening mammogram for malignant neoplasm of breast: Secondary | ICD-10-CM

## 2021-12-18 NOTE — Telephone Encounter (Signed)
Pt called and states that she was to have a bone density and she needs that to go to [facility] and [facility] went [facility]. [facility] to [facility]. That is where she usually goes.

## 2021-12-18 NOTE — Telephone Encounter (Signed)
I have faxed over order to [facility]

## 2022-01-03 DIAGNOSIS — H40013 Open angle with borderline findings, low risk, bilateral: Secondary | ICD-10-CM | POA: Diagnosis not present

## 2022-02-15 DIAGNOSIS — Z780 Asymptomatic menopausal state: Secondary | ICD-10-CM | POA: Diagnosis not present

## 2022-02-15 DIAGNOSIS — M8589 Other specified disorders of bone density and structure, multiple sites: Secondary | ICD-10-CM | POA: Diagnosis not present

## 2022-02-15 LAB — HM DEXA SCAN

## 2022-03-07 ENCOUNTER — Encounter: Payer: Self-pay | Admitting: Family Medicine

## 2022-03-07 ENCOUNTER — Ambulatory Visit: Payer: Medicare PPO | Admitting: Family Medicine

## 2022-03-07 VITALS — BP 138/86 | HR 65 | Temp 98.7°F | Resp 16 | Wt 147.4 lb

## 2022-03-07 DIAGNOSIS — L089 Local infection of the skin and subcutaneous tissue, unspecified: Secondary | ICD-10-CM | POA: Diagnosis not present

## 2022-03-07 DIAGNOSIS — L723 Sebaceous cyst: Secondary | ICD-10-CM

## 2022-03-07 MED ORDER — LIDOCAINE HCL (PF) 1 % IJ SOLN: 3.0000 mL | Freq: Once | INTRAMUSCULAR | Status: AC

## 2022-03-07 NOTE — Progress Notes (Signed)
   Subjective:    Patient ID: Tamara Evans, female    DOB: 19-Jun-1945, 77 y.o.   MRN: [id number]  HPI She is here for evaluation of a lesion in the left parietal area near the midline.  It is starting become slightly tender.   Review of Systems     Objective:   Physical Exam 2 cm lesion noted in the mid left parietal area near the midline that is slightly erythematous.  There was 1 area that looks like it was ready to open up.       Assessment & Plan:  Infected sebaceous cyst The lesion was injected with Xylocaine and epinephrine.  A 2 cm incision was made.  No purulent material was expressed.  The sac was removed in toto.  It was packed with iodoform.  She is to leave this in place for 2 days and then remove it and irrigate the area several times per day.

## 2022-04-22 ENCOUNTER — Ambulatory Visit
Admission: RE | Admit: 2022-04-22 | Discharge: 2022-04-22 | Disposition: A | Discharge status: Home / Self Care | Payer: Medicare PPO | Source: Ambulatory Visit | Attending: Family Medicine | Admitting: Family Medicine

## 2022-04-22 DIAGNOSIS — Z1231 Encounter for screening mammogram for malignant neoplasm of breast: Secondary | ICD-10-CM

## 2022-08-05 DIAGNOSIS — J301 Allergic rhinitis due to pollen: Secondary | ICD-10-CM | POA: Diagnosis not present

## 2022-08-05 DIAGNOSIS — Z8249 Family history of ischemic heart disease and other diseases of the circulatory system: Secondary | ICD-10-CM | POA: Diagnosis not present

## 2022-08-05 DIAGNOSIS — Z809 Family history of malignant neoplasm, unspecified: Secondary | ICD-10-CM | POA: Diagnosis not present

## 2022-08-05 DIAGNOSIS — H269 Unspecified cataract: Secondary | ICD-10-CM | POA: Diagnosis not present

## 2022-08-05 DIAGNOSIS — E785 Hyperlipidemia, unspecified: Secondary | ICD-10-CM | POA: Diagnosis not present

## 2022-08-05 DIAGNOSIS — M8580 Other specified disorders of bone density and structure, unspecified site: Secondary | ICD-10-CM | POA: Diagnosis not present

## 2022-08-05 DIAGNOSIS — I951 Orthostatic hypotension: Secondary | ICD-10-CM | POA: Diagnosis not present

## 2022-08-05 DIAGNOSIS — E039 Hypothyroidism, unspecified: Secondary | ICD-10-CM | POA: Diagnosis not present

## 2022-08-05 DIAGNOSIS — N1831 Chronic kidney disease, stage 3a: Secondary | ICD-10-CM | POA: Diagnosis not present

## 2022-12-03 ENCOUNTER — Telehealth: Payer: Self-pay | Admitting: Family Medicine

## 2022-12-03 ENCOUNTER — Ambulatory Visit: Payer: Medicare PPO

## 2022-12-03 DIAGNOSIS — E7439 Other disorders of intestinal carbohydrate absorption: Secondary | ICD-10-CM

## 2022-12-03 DIAGNOSIS — Z0000 Encounter for general adult medical examination without abnormal findings: Secondary | ICD-10-CM | POA: Diagnosis not present

## 2022-12-03 DIAGNOSIS — E7800 Pure hypercholesterolemia, unspecified: Secondary | ICD-10-CM

## 2022-12-03 NOTE — Patient Instructions (Signed)
Tamara Evans , Thank you for taking time to come for your Medicare Wellness Visit. I appreciate your ongoing commitment to your health goals. Please review the following plan we discussed and let me know if I can assist you in the future.   Referrals/Orders/Follow-Ups/Clinician Recommendations: none  This is a list of the screening recommended for you and due dates:  Health Maintenance  Topic Date Due   COVID-19 Vaccine (7 - 2023-24 season) 03/27/2023   Medicare Annual Wellness Visit  12/03/2023   DTaP/Tdap/Td vaccine (5 - Td or Tdap) 04/20/2031   Flu Shot  Completed   DEXA scan (bone density measurement)  Completed   Hepatitis C Screening  Completed   Zoster (Shingles) Vaccine  Completed   HPV Vaccine  Aged Out   Pneumonia Vaccine  Discontinued   Colon Cancer Screening  Discontinued    Advanced directives: (Copy Requested) Please bring a copy of your health care power of attorney and living will to the office to be added to your chart at your convenience.  Next Medicare Annual Wellness Visit scheduled for next year: Yes  Insert Preventive Care attachment Insert FALL PREVENTION attachment if needed

## 2022-12-03 NOTE — Progress Notes (Signed)
Subjective:   Tamara Evans is a 77 y.o. female who presents for Medicare Annual (Subsequent) preventive examination.  Visit Complete: Virtual I connected with  Tamara Evans on 12/03/22 by a audio enabled telemedicine application and verified that I am speaking with the correct person using two identifiers.  Patient Location: Home  Provider Location: Office/Clinic  I discussed the limitations of evaluation and management by telemedicine. The patient expressed understanding and agreed to proceed.  Vital Signs: Because this visit was a virtual/telehealth visit, some criteria may be missing or patient reported. Any vitals not documented were not able to be obtained and vitals that have been documented are patient reported.  Patient Medicare AWV questionnaire was completed by the patient on 11/29/2022; I have confirmed that all information answered by patient is correct and no changes since this date.  Cardiac Risk Factors include: advanced age (>43men, >16 women)     Objective:    Today's Vitals   There is no height or weight on file to calculate BMI.     12/03/2022    2:00 PM 11/30/2021    4:04 PM 11/20/2020    9:31 AM 11/11/2019    9:28 AM 11/10/2018    1:09 PM 11/04/2017    1:48 PM 11/05/2016   10:50 AM  Advanced Directives  Does Patient Have a Medical Advance Directive? Yes Yes Yes Yes No Yes Yes  Type of Estate agent of [place];Living will Healthcare Power of [place];Living will Living will;Out of facility DNR (pink MOST or yellow form) Living will;Healthcare Power of [place];Out of facility DNR (pink MOST or yellow form)  Healthcare Power of [place];Living will   Does patient want to make changes to medical advance directive?   No - Patient declined No - Patient declined  No - Patient declined No - Patient declined  Copy of Healthcare Power of Attorney in Chart? No - copy requested No - copy requested  No - copy requested     Would patient like  information on creating a medical advance directive?    No - Patient declined Yes (MAU/Ambulatory/Procedural Areas - Information given)      Current Medications (verified) Outpatient Encounter Medications as of 12/03/2022  Medication Sig   atorvastatin (LIPITOR) 20 MG tablet Take 1 tablet (20 mg total) by mouth daily.   calcium carbonate (OS-CAL) 600 MG TABS Take 600 mg by mouth 1 day or 1 dose.   levothyroxine (SYNTHROID) 88 MCG tablet Take 1 tablet (88 mcg total) by mouth daily. (Patient taking differently: Take 88 mcg by mouth daily. BRAND ONLY)   Multiple Vitamin (MULTIVITAMIN) tablet Take 1 tablet by mouth daily.   Facility-Administered Encounter Medications as of 12/03/2022  Medication   lidocaine (PF) (XYLOCAINE) 1 % injection 3 mL    Allergies (verified) Codeine   History: Past Medical History:  Diagnosis Date   Allergy    perennial   Breast cancer (HCC) 2012   right breast stage I, er/pr positive, invasive ductal CA. Treated with lumpectomy and radiation   Fibrocystic breast disease    Goiter, nodular 05/06   managed by Dr. Juleen China   Hx of radiation therapy    right breast   Hyperlipidemia    Menopause    Multinodular goiter    Osteopenia 6/07   L-spine   Personal history of radiation therapy    PMS (premenstrual syndrome)    Past Surgical History:  Procedure Laterality Date   BREAST BIOPSY Left 09/18/2010   benign  BREAST BIOPSY Right 09/18/2010   BREAST LUMPECTOMY  10/05/2010   lumpectomy and sentinel node biopsy (Right)   CERVICAL POLYPECTOMY     in the patient's 20's   Family History  Problem Relation Age of Onset   Dementia Mother    Osteoporosis Mother    Heart disease Mother        pacemaker   Diabetes Mother    Hypertension Father    Hyperlipidemia Father    Hyperlipidemia Brother    Cancer Maternal Grandfather        31's, ? type (smoker)   Diabetes Maternal Grandfather    Cancer Paternal Grandfather    Thyroid disease Paternal Grandmother         goiter   Colon cancer Neg Hx    Social History   Socioeconomic History   Marital status: Single    Spouse name: Not on file   Number of children: 0   Years of education: Not on file   Highest education level: Not on file  Occupational History   Occupation: Firefighter: [employer]  Tobacco Use   Smoking status: Never   Smokeless tobacco: Never  Vaping Use   Vaping status: Never Used  Substance and Sexual Activity   Alcohol use: Yes    Alcohol/week: 0.0 standard drinks of alcohol    Comment: 3-4 glasses wine per month.   Drug use: No   Sexual activity: Not Currently  Other Topics Concern   Not on file  Social History Narrative   Lives alone, 1 cat   Social Determinants of Health   Financial Resource Strain: Low Risk  (11/29/2022)   Overall Financial Resource Strain (CARDIA)    Difficulty of Paying Living Expenses: Not hard at all  Food Insecurity: No Food Insecurity (11/29/2022)   Hunger Vital Sign    Worried About Running Out of Food in the Last Year: Never true    Ran Out of Food in the Last Year: Never true  Transportation Needs: No Transportation Needs (11/29/2022)   PRAPARE - Administrator, Civil Service (Medical): No    Lack of Transportation (Non-Medical): No  Physical Activity: Sufficiently Active (11/29/2022)   Exercise Vital Sign    Days of Exercise per Week: 5 days    Minutes of Exercise per Session: 30 min  Stress: No Stress Concern Present (11/29/2022)   [employer] of Occupational Health - Occupational Stress Questionnaire    Feeling of Stress : Only a little  Social Connections: Unknown (11/29/2022)   Social Connection and Isolation Panel [NHANES]    Frequency of Communication with Friends and Family: Once a week    Frequency of Social Gatherings with Friends and Family: Twice a week    Attends Religious Services: Not on Marketing executive or Organizations: Yes    Attends Engineer, structural:  More than 4 times per year    Marital Status: Never married    Tobacco Counseling Counseling given: Not Answered   Clinical Intake:  Pre-visit preparation completed: Yes  Pain : No/denies pain     Nutritional Risks: None Diabetes: No  How often do you need to have someone help you when you read instructions, pamphlets, or other written materials from your doctor or pharmacy?: 1 - Never  Interpreter Needed?: No  Information entered by :: NAllen LPN   Activities of Daily Living    11/29/2022    7:33 PM  In  your present state of health, do you have any difficulty performing the following activities:  Hearing? 0  Vision? 0  Difficulty concentrating or making decisions? 0  Walking or climbing stairs? 0  Dressing or bathing? 0  Doing errands, shopping? 0  Preparing Food and eating ? N  Using the Toilet? N  In the past six months, have you accidently leaked urine? N  Do you have problems with loss of bowel control? N  Managing your Medications? N  Managing your Finances? N  Housekeeping or managing your Housekeeping? N    Patient Care Team: Ronnald Nian, MD as PCP - General (Family Medicine)  Indicate any recent Medical Services you may have received from other than [facility] providers in the past year (date may be approximate).     Assessment:   This is a routine wellness examination for [place].  Hearing/Vision screen Hearing Screening - Comments:: Denies hearing issues Vision Screening - Comments:: Regular eye exams, [facility]   Goals Addressed             This Visit's Progress    Patient Stated       12/03/2022, wants to lose weight, eat healthier       Depression Screen    12/03/2022    2:01 PM 11/30/2021    4:05 PM 11/20/2020    9:33 AM 11/11/2019    9:30 AM 11/10/2018    9:44 AM 11/04/2017    1:22 PM 11/05/2016   10:41 AM  PHQ 2/9 Scores  PHQ - 2 Score 1 0 0 0 0 0 1  PHQ- 9 Score 1          Fall Risk    11/29/2022    7:33 PM 03/07/2022     2:02 PM 11/30/2021    4:05 PM 11/20/2020    9:33 AM 11/11/2019    9:29 AM  Fall Risk   Falls in the past year? 0 0 0 0 0  Number falls in past yr: 0 0 0 0   Injury with Fall? 0 0 0 0   Risk for fall due to : Medication side effect No Fall Risks No Fall Risks;Medication side effect No Fall Risks   Follow up Falls prevention discussed;Falls evaluation completed Falls evaluation completed Falls prevention discussed;Education provided;Falls evaluation completed Falls evaluation completed     MEDICARE RISK AT HOME: Medicare Risk at Home Any stairs in or around the home?: Yes If so, are there any without handrails?: No Home free of loose throw rugs in walkways, pet beds, electrical cords, etc?: No Adequate lighting in your home to reduce risk of falls?: Yes Life alert?: No Use of a cane, walker or w/c?: No Grab bars in the bathroom?: Yes Shower chair or bench in shower?: Yes Elevated toilet seat or a handicapped toilet?: Yes  TIMED UP AND GO:  Was the test performed?  No    Cognitive Function:        12/03/2022    2:03 PM 11/30/2021    4:08 PM  6CIT Screen  What Year? 0 points 0 points  What month? 0 points 0 points  What time? 0 points 0 points  Count back from 20 0 points 0 points  Months in reverse 0 points 0 points  Repeat phrase 0 points 0 points  Total Score 0 points 0 points    Immunizations Immunization History  Administered Date(s) Administered   DT (Pediatric) 07/04/1994, 05/22/2004   Fluad Quad(high Dose 65+) 10/08/2021  Fluad Trivalent(High Dose 65+) 10/24/2022   Influenza Split 12/15/2000, 10/29/2010, 11/17/2012   Influenza, High Dose Seasonal PF 10/22/2013, 10/24/2014, 10/30/2015, 10/30/2016, 10/30/2017, 10/16/2018, 11/10/2020   Influenza-Unspecified 10/30/2016, 11/04/2019   PFIZER Comirnaty(Gray Top)Covid-19 Tri-Sucrose Vaccine 06/08/2020   PFIZER(Purple Top)SARS-COV-2 Vaccination 02/19/2019, 03/15/2019, 10/28/2019   Pfizer Covid-19 Vaccine Bivalent  Booster 75yrs & up 10/24/2020   Pfizer(Comirnaty)Fall Seasonal Vaccine 12 years and older 10/29/2021, 11/24/2022   Pneumococcal Conjugate-13 10/22/2013   Pneumococcal Polysaccharide-23 09/03/2007   Respiratory Syncytial Virus Vaccine,Recomb Aduvanted(Arexvy) 12/10/2021   Tdap 02/11/2011, 04/19/2021   Unspecified SARS-COV-2 Vaccination 10/29/2021   Zoster Recombinant(Shingrix) 08/01/2017, 12/12/2017   Zoster, Live 09/08/2007    TDAP status: Up to date  Flu Vaccine status: Up to date  Pneumococcal vaccine status: Up to date  Covid-19 vaccine status: Completed vaccines  Qualifies for Shingles Vaccine? Yes   Zostavax completed Yes   Shingrix Completed?: Yes  Screening Tests Health Maintenance  Topic Date Due   COVID-19 Vaccine (7 - 2023-24 season) 03/27/2023   Medicare Annual Wellness (AWV)  12/03/2023   DTaP/Tdap/Td (5 - Td or Tdap) 04/20/2031   INFLUENZA VACCINE  Completed   DEXA SCAN  Completed   Hepatitis C Screening  Completed   Zoster Vaccines- Shingrix  Completed   HPV VACCINES  Aged Out   Pneumonia Vaccine 16+ Years old  Discontinued   Colonoscopy  Discontinued    Health Maintenance  There are no preventive care reminders to display for this patient.   Colorectal cancer screening: No longer required.   Mammogram status: Completed 04/22/2022. Repeat every year  Bone Density status: Completed 02/15/2022.   Lung Cancer Screening: (Low Dose CT Chest recommended if Age 42-80 years, 20 pack-year currently smoking OR have quit w/in 15years.) does not qualify.   Lung Cancer Screening Referral: no  Additional Screening:  Hepatitis C Screening: does qualify; Completed 11/02/2015  Vision Screening: Recommended annual ophthalmology exams for early detection of glaucoma and other disorders of the eye. Is the patient up to date with their annual eye exam?  Yes  Who is the provider or what is the name of the office in which the patient attends annual eye exams? [facility] If pt is not established with a provider, would they like to be referred to a provider to establish care? No .   Dental Screening: Recommended annual dental exams for proper oral hygiene  Diabetic Foot Exam: n/a  Community Resource Referral / Chronic Care Management: CRR required this visit?  No   CCM required this visit?  No     Plan:     I have personally reviewed and noted the following in the patient's chart:   Medical and social history Use of alcohol, tobacco or illicit drugs  Current medications and supplements including opioid prescriptions. Patient is not currently taking opioid prescriptions. Functional ability and status Nutritional status Physical activity Advanced directives List of other physicians Hospitalizations, surgeries, and ER visits in previous 12 months Vitals Screenings to include cognitive, depression, and falls Referrals and appointments  In addition, I have reviewed and discussed with patient certain preventive protocols, quality metrics, and best practice recommendations. A written personalized care plan for preventive services as well as general preventive health recommendations were provided to patient.     Barb Merino, LPN   41/03/2438   After Visit Summary: (MyChart) Due to this being a telephonic visit, the after visit summary with patients personalized plan was offered to patient via MyChart   Nurse Notes:  none

## 2022-12-03 NOTE — Telephone Encounter (Signed)
Pt wants to come in and have fasting labs prior to cpe on 11/19  ? Ok and if so , please place orders

## 2022-12-06 ENCOUNTER — Other Ambulatory Visit: Payer: Self-pay | Admitting: Family Medicine

## 2022-12-06 DIAGNOSIS — E041 Nontoxic single thyroid nodule: Secondary | ICD-10-CM

## 2022-12-10 ENCOUNTER — Other Ambulatory Visit: Payer: Medicare PPO

## 2022-12-10 DIAGNOSIS — E7800 Pure hypercholesterolemia, unspecified: Secondary | ICD-10-CM

## 2022-12-10 DIAGNOSIS — E7439 Other disorders of intestinal carbohydrate absorption: Secondary | ICD-10-CM | POA: Diagnosis not present

## 2022-12-11 LAB — LIPID PANEL
Chol/HDL Ratio: 2.7 ratio (ref 0.0–4.4)
Cholesterol, Total: 174 mg/dL (ref 100–199)
HDL: 65 mg/dL (ref >39)
LDL Chol Calc (NIH): 86 mg/dL (ref 0–99)
Triglycerides: 132 mg/dL (ref 0–149)
VLDL Cholesterol Cal: 23 mg/dL (ref 5–40)

## 2022-12-11 LAB — CBC WITH DIFFERENTIAL/PLATELET
Basophils Absolute: 0 10*3/uL (ref 0.0–0.2)
Basos: 1 %
EOS (ABSOLUTE): 0.2 10*3/uL (ref 0.0–0.4)
Eos: 3 %
Hematocrit: 48.6 % — ABNORMAL HIGH (ref 34.0–46.6)
Hemoglobin: 15.8 g/dL (ref 11.1–15.9)
Immature Grans (Abs): 0 10*3/uL (ref 0.0–0.1)
Immature Granulocytes: 0 %
Lymphocytes Absolute: 1.3 10*3/uL (ref 0.7–3.1)
Lymphs: 25 %
MCH: 26.5 pg — ABNORMAL LOW (ref 26.6–33.0)
MCHC: 32.5 g/dL (ref 31.5–35.7)
MCV: 81 fL (ref 79–97)
Monocytes Absolute: 0.5 10*3/uL (ref 0.1–0.9)
Monocytes: 10 %
Neutrophils Absolute: 3.2 10*3/uL (ref 1.4–7.0)
Neutrophils: 61 %
Platelets: 208 10*3/uL (ref 150–450)
RBC: 5.97 x10E6/uL — ABNORMAL HIGH (ref 3.77–5.28)
RDW: 13.9 % (ref 11.7–15.4)
WBC: 5.2 10*3/uL (ref 3.4–10.8)

## 2022-12-11 LAB — COMPREHENSIVE METABOLIC PANEL
ALT: 36 [IU]/L — ABNORMAL HIGH (ref 0–32)
AST: 28 IU/L (ref 0–40)
Albumin: 4.2 g/dL (ref 3.8–4.8)
Alkaline Phosphatase: 106 [IU]/L (ref 44–121)
BUN/Creatinine Ratio: 28 (ref 12–28)
BUN: 23 mg/dL (ref 8–27)
Bilirubin Total: 0.6 mg/dL (ref 0.0–1.2)
CO2: 25 mmol/L (ref 20–29)
Calcium: 9.5 mg/dL (ref 8.7–10.3)
Chloride: 104 mmol/L (ref 96–106)
Creatinine, Ser: 0.82 mg/dL (ref 0.57–1.00)
Globulin, Total: 2.6 g/dL (ref 1.5–4.5)
Glucose: 106 mg/dL — ABNORMAL HIGH (ref 70–99)
Potassium: 4.4 mmol/L (ref 3.5–5.2)
Sodium: 143 mmol/L (ref 134–144)
Total Protein: 6.8 g/dL (ref 6.0–8.5)
eGFR: 74 mL/min/{1.73_m2} (ref >59)

## 2022-12-12 LAB — SPECIMEN STATUS REPORT

## 2022-12-12 LAB — HGB A1C W/O EAG: Hgb A1c MFr Bld: 6.1 % — ABNORMAL HIGH (ref 4.8–5.6)

## 2022-12-17 ENCOUNTER — Encounter: Payer: Self-pay | Admitting: Family Medicine

## 2022-12-17 ENCOUNTER — Ambulatory Visit (INDEPENDENT_AMBULATORY_CARE_PROVIDER_SITE_OTHER): Payer: Medicare PPO | Admitting: Family Medicine

## 2022-12-17 VITALS — BP 126/84 | HR 74 | Ht 64.0 in | Wt 148.8 lb

## 2022-12-17 DIAGNOSIS — J301 Allergic rhinitis due to pollen: Secondary | ICD-10-CM

## 2022-12-17 DIAGNOSIS — Z923 Personal history of irradiation: Secondary | ICD-10-CM | POA: Diagnosis not present

## 2022-12-17 DIAGNOSIS — M8580 Other specified disorders of bone density and structure, unspecified site: Secondary | ICD-10-CM | POA: Diagnosis not present

## 2022-12-17 DIAGNOSIS — E041 Nontoxic single thyroid nodule: Secondary | ICD-10-CM | POA: Diagnosis not present

## 2022-12-17 DIAGNOSIS — Z853 Personal history of malignant neoplasm of breast: Secondary | ICD-10-CM

## 2022-12-17 DIAGNOSIS — E7800 Pure hypercholesterolemia, unspecified: Secondary | ICD-10-CM

## 2022-12-17 DIAGNOSIS — Z0000 Encounter for general adult medical examination without abnormal findings: Secondary | ICD-10-CM | POA: Diagnosis not present

## 2022-12-17 DIAGNOSIS — E7439 Other disorders of intestinal carbohydrate absorption: Secondary | ICD-10-CM | POA: Diagnosis not present

## 2022-12-17 LAB — POCT GLYCOSYLATED HEMOGLOBIN (HGB A1C): Hemoglobin A1C: 6.1 % — AB (ref 4.0–5.6)

## 2022-12-17 MED ORDER — LEVOTHYROXINE SODIUM 88 MCG PO TABS: 88.0000 ug | ORAL_TABLET | Freq: Every day | ORAL | 0 refills | Status: DC

## 2022-12-17 MED ORDER — ATORVASTATIN CALCIUM 20 MG PO TABS: 20.0000 mg | ORAL_TABLET | Freq: Every day | ORAL | 3 refills | Status: DC

## 2022-12-17 NOTE — Progress Notes (Signed)
Tamara Evans is a 77 y.o. female who presents for a CPEand follow-up on chronic medical conditions.  She has no particular concerns or complaints other than occasional right knee discomfort.  She continues on her thyroid medication and is having no difficulty with that.  She also had a recent DEXA scan which did show continued osteopenia.  She continues on Lipitor and is having no difficulty with that.  Does have a couple of blood sugars are elevated.  She does get regular mammograms with her underlying history of breast cancer.  Allergies seem to be under good control.  Immunizations and Health Maintenance Immunization History  Administered Date(s) Administered   DT (Pediatric) 07/04/1994, 05/22/2004   Fluad Quad(high Dose 65+) 10/08/2021   Fluad Trivalent(High Dose 65+) 10/24/2022   Influenza Split 12/15/2000, 10/29/2010, 11/17/2012   Influenza, High Dose Seasonal PF 10/22/2013, 10/24/2014, 10/30/2015, 10/30/2016, 10/30/2017, 10/16/2018, 11/10/2020   Influenza-Unspecified 10/30/2016, 11/04/2019   PFIZER Comirnaty(Gray Top)Covid-19 Tri-Sucrose Vaccine 06/08/2020   PFIZER(Purple Top)SARS-COV-2 Vaccination 02/19/2019, 03/15/2019, 10/28/2019   Pfizer Covid-19 Vaccine Bivalent Booster 71yrs & up 10/24/2020   Pfizer(Comirnaty)Fall Seasonal Vaccine 12 years and older 10/29/2021, 11/24/2022   Pneumococcal Conjugate-13 10/22/2013   Pneumococcal Polysaccharide-23 09/03/2007   Respiratory Syncytial Virus Vaccine,Recomb Aduvanted(Arexvy) 12/10/2021   Tdap 02/11/2011, 04/19/2021   Unspecified SARS-COV-2 Vaccination 10/29/2021   Zoster Recombinant(Shingrix) 08/01/2017, 12/12/2017   Zoster, Live 09/08/2007   There are no preventive care reminders to display for this patient.  Last Pap smear:N/A Last mammogram: Yearly. Last colonoscopy: Last DEXA: 2024   Depression screen:  See questionnaire below.     12/03/2022    2:01 PM 11/30/2021    4:05 PM 11/20/2020    9:33 AM 11/11/2019    9:30 AM  11/10/2018    9:44 AM  Depression screen PHQ 2/9  Decreased Interest 0 0 0 0 0  Down, Depressed, Hopeless 1 0 0 0 0  PHQ - 2 Score 1 0 0 0 0  Altered sleeping 0      Tired, decreased energy 0      Change in appetite 0      Feeling bad or failure about yourself  0      Trouble concentrating 0      Moving slowly or fidgety/restless 0      Suicidal thoughts 0      PHQ-9 Score 1      Difficult doing work/chores Not difficult at all        Fall Risk Screen: see questionnaire below.    11/29/2022    7:33 PM 03/07/2022    2:02 PM 11/30/2021    4:05 PM 11/20/2020    9:33 AM 11/11/2019    9:29 AM  Fall Risk   Falls in the past year? 0 0 0 0 0  Number falls in past yr: 0 0 0 0   Injury with Fall? 0 0 0 0   Risk for fall due to : Medication side effect No Fall Risks No Fall Risks;Medication side effect No Fall Risks   Follow up Falls prevention discussed;Falls evaluation completed Falls evaluation completed Falls prevention discussed;Education provided;Falls evaluation completed Falls evaluation completed     ADL screen:  See questionnaire below      Review of Systems Constitutional: -, -unexpected weight change, -anorexia, -fatigue Allergy: -sneezing, -itching, -congestion Dermatology: denies changing moles, rash, lumps ENT: -runny nose, -ear pain, -sore throat,  Cardiology:  -chest pain, -palpitations, -orthopnea, Respiratory: -cough, -shortness of breath, -dyspnea on exertion, -wheezing,  Gastroenterology: -abdominal pain, -nausea, -vomiting, -diarrhea, -constipation, -dysphagia Hematology: -bleeding or bruising problems Musculoskeletal: -arthralgias, -myalgias, -joint swelling, -back pain, - Ophthalmology: -vision changes,  Urology: -dysuria, -difficulty urinating,  -urinary frequency, -urgency, incontinence Neurology: -, -numbness, , -memory loss, -falls, -dizziness    PHYSICAL EXAM:  BP 126/84   Pulse 74   Ht 5\' 4"  (1.626 m)   Wt 148 lb 12.8 oz (67.5 kg)   BMI 25.54  kg/m   General Appearance: Alert, cooperative, no distress, appears stated age Head: Normocephalic, without obvious abnormality, atraumatic Eyes: PERRL, conjunctiva/corneas clear, EOM's intact, Ears: Normal TM's and external ear canals Nose: Nares normal, mucosa normal, no drainage or sinus tenderness Throat: Lips, mucosa, and tongue normal; teeth and gums normal Neck: Supple, no lymphadenopathy;  thyroid:  no enlargement/tenderness/nodules; no carotid bruit or JVD Lungs: Clear to auscultation bilaterally without wheezes, rales or ronchi; respirations unlabored Heart: Regular rate and rhythm, S1 and S2 normal, no murmur, rubor gallop Skin:  Skin color, texture, turgor normal, no rashes or lesions Lymph nodes: Cervical, supraclavicular, and axillary nodes normal Neurologic:  CNII-XII intact, normal strength, sensation and gait; reflexes 2+ and symmetric throughout Psych: Normal mood, affect, hygiene and grooming.  ASSESSMENT/PLAN: History of breast cancer  Hx of radiation therapy  Osteopenia, unspecified location  Pure hypercholesterolemia - Plan: atorvastatin (LIPITOR) 20 MG tablet  Seasonal allergic rhinitis due to pollen  Thyroid nodule - Plan: TSH, levothyroxine (SYNTHROID) 88 MCG tablet  Glucose intolerance - Plan: POCT glycosylated hemoglobin (Hb A1C)     Immunization recommendations discussed.  Colonoscopy recommendations reviewed   Medicare Attestation I have personally reviewed: The patient's medical and social history Their use of alcohol, tobacco or illicit drugs Their current medications and supplements The patient's functional ability including ADLs,fall risks, home safety risks, cognitive, and hearing and visual impairment Diet and physical activities Evidence for depression or mood disorders  The patient's weight, height, and BMI have been recorded in the chart.  I have made referrals, counseling, and provided education to the patient based on review of the  above and I have provided the patient with a written personalized care plan for preventive services.     [name], MD   12/17/2022

## 2022-12-18 LAB — TSH: TSH: 0.765 u[IU]/mL (ref 0.450–4.500)

## 2023-01-07 DIAGNOSIS — H40013 Open angle with borderline findings, low risk, bilateral: Secondary | ICD-10-CM | POA: Diagnosis not present

## 2023-01-15 ENCOUNTER — Other Ambulatory Visit: Payer: Self-pay | Admitting: Family Medicine

## 2023-01-15 DIAGNOSIS — Z1231 Encounter for screening mammogram for malignant neoplasm of breast: Secondary | ICD-10-CM

## 2023-02-05 DIAGNOSIS — H04123 Dry eye syndrome of bilateral lacrimal glands: Secondary | ICD-10-CM | POA: Diagnosis not present

## 2023-03-08 ENCOUNTER — Other Ambulatory Visit: Payer: Self-pay | Admitting: Family Medicine

## 2023-03-08 DIAGNOSIS — E041 Nontoxic single thyroid nodule: Secondary | ICD-10-CM

## 2023-03-25 ENCOUNTER — Encounter: Payer: Self-pay | Admitting: Internal Medicine

## 2023-04-24 ENCOUNTER — Ambulatory Visit: Payer: Medicare PPO

## 2023-04-29 ENCOUNTER — Ambulatory Visit
Admission: RE | Admit: 2023-04-29 | Discharge: 2023-04-29 | Disposition: A | Discharge status: Home / Self Care | Source: Ambulatory Visit | Attending: Family Medicine | Admitting: Family Medicine

## 2023-04-29 DIAGNOSIS — Z1231 Encounter for screening mammogram for malignant neoplasm of breast: Secondary | ICD-10-CM | POA: Diagnosis not present

## 2023-05-02 ENCOUNTER — Other Ambulatory Visit: Payer: Self-pay | Admitting: Family Medicine

## 2023-05-02 DIAGNOSIS — R928 Other abnormal and inconclusive findings on diagnostic imaging of breast: Secondary | ICD-10-CM

## 2023-05-14 ENCOUNTER — Ambulatory Visit
Admission: RE | Admit: 2023-05-14 | Discharge: 2023-05-14 | Disposition: A | Discharge status: Home / Self Care | Source: Ambulatory Visit | Attending: Family Medicine | Admitting: Family Medicine

## 2023-05-14 DIAGNOSIS — R928 Other abnormal and inconclusive findings on diagnostic imaging of breast: Secondary | ICD-10-CM

## 2023-05-14 DIAGNOSIS — N6002 Solitary cyst of left breast: Secondary | ICD-10-CM | POA: Diagnosis not present

## 2023-05-15 NOTE — Telephone Encounter (Unsigned)
 Copied from CRM [id number]. Topic: Referral - Question >> May 15, 2023  2:26 PM Everlene Hobby D wrote: She wants to know if she needs a referral to book a appointment for dermatologist . Says it's just routine for her she never needed one before.

## 2023-08-19 DIAGNOSIS — C50911 Malignant neoplasm of unspecified site of right female breast: Secondary | ICD-10-CM | POA: Diagnosis not present

## 2023-09-18 ENCOUNTER — Telehealth: Payer: Self-pay | Admitting: Family Medicine

## 2023-09-18 NOTE — Telephone Encounter (Signed)
 Copied from CRM #[id number]. Topic: Appointments - Scheduling Inquiry for Clinic >> Sep 18, 2023  2:39 PM Fonda T wrote: Reason for CRM: Received call from patient, requesting to schedule appointment with a different provider, specifically Camie Doing, NP, as per patient she specializes in geratology. Patient is currently scheduled on 12/24/23 with Dr. Vita.  Informed patient Camie Doing, NP is not accepting new patient and per [facility] Dr. Yvone patients are being scheduled with Dr. Vita.  Patient would like to know if an exception can be made to schedule with Camie.  Also patient is requesting labs be ordered prior to physical appointment and a lab appointment scheduled prior to physical appointment.  Patient can be reached at [phone].  Spoke to pt and advised that Camie not taking new patients, she said she has been thinking and thinks maybe she needs to see a specialist for her care due to her age. She will keep current appts for now

## 2023-10-08 ENCOUNTER — Encounter: Payer: Self-pay | Admitting: Family Medicine

## 2023-11-21 ENCOUNTER — Encounter: Payer: Self-pay | Admitting: Family Medicine

## 2023-11-21 ENCOUNTER — Ambulatory Visit: Payer: Self-pay

## 2023-11-21 ENCOUNTER — Telehealth: Payer: Self-pay | Admitting: Family Medicine

## 2023-11-21 ENCOUNTER — Ambulatory Visit: Admitting: Family Medicine

## 2023-11-21 VITALS — BP 122/74 | HR 84 | Wt 146.8 lb

## 2023-11-21 DIAGNOSIS — Z136 Encounter for screening for cardiovascular disorders: Secondary | ICD-10-CM | POA: Diagnosis not present

## 2023-11-21 DIAGNOSIS — J069 Acute upper respiratory infection, unspecified: Secondary | ICD-10-CM | POA: Diagnosis not present

## 2023-11-21 DIAGNOSIS — R0981 Nasal congestion: Secondary | ICD-10-CM

## 2023-11-21 DIAGNOSIS — Z853 Personal history of malignant neoplasm of breast: Secondary | ICD-10-CM

## 2023-11-21 DIAGNOSIS — R059 Cough, unspecified: Secondary | ICD-10-CM | POA: Diagnosis not present

## 2023-11-21 DIAGNOSIS — E041 Nontoxic single thyroid nodule: Secondary | ICD-10-CM | POA: Diagnosis not present

## 2023-11-21 LAB — POC COVID19 BINAXNOW: SARS Coronavirus 2 Ag: NEGATIVE

## 2023-11-21 LAB — POCT INFLUENZA A/B
Influenza A, POC: NEGATIVE
Influenza B, POC: NEGATIVE

## 2023-11-21 MED ORDER — METHYLPREDNISOLONE 4 MG PO TBPK: ORAL_TABLET | ORAL | 0 refills | Status: DC

## 2023-11-21 MED ORDER — FLUTICASONE PROPIONATE 50 MCG/ACT NA SUSP: 2.0000 | Freq: Every day | NASAL | 6 refills | Status: AC

## 2023-11-21 MED ORDER — AZITHROMYCIN 250 MG PO TABS: ORAL_TABLET | ORAL | 0 refills | Status: AC

## 2023-11-21 NOTE — Telephone Encounter (Signed)
 Pt coming in for labs prior to CPE, orders are in

## 2023-11-21 NOTE — Telephone Encounter (Signed)
 FYI Only or Action Required?: FYI only for provider.  Patient was last seen in primary care on 12/17/2022 by Joyce Norleen BROCKS, MD.  Called Nurse Triage reporting Nasal Congestion.  Symptoms began several weeks ago.  Interventions attempted: OTC medications: Claritin.  Symptoms are: gradually worsening.  Triage Disposition: See PCP When Office is Open (Within 3 Days)  Patient/caregiver understands and will follow disposition?: Yes  Copied from CRM [id number]. Topic: Clinical - Red Word Triage >> Nov 21, 2023  9:36 AM Tamara Evans wrote: Red Word that prompted transfer to Nurse Triage:  Patient has a runny nose, dry cough, her chest feels heavy. Reason for Disposition  [1] Nasal discharge AND [2] present > 10 days  Answer Assessment - Initial Assessment Questions 1. ONSET: When did the cough begin?      *No Answer* 2. SEVERITY: How bad is the cough today?      *No Answer* 3. SPUTUM: Describe the color of your sputum (e.g., none, dry cough; clear, white, yellow, green)     *No Answer* 4. HEMOPTYSIS: Are you coughing up any blood? If Yes, ask: How much? (e.g., flecks, streaks, tablespoons, etc.)     *No Answer* 5. DIFFICULTY BREATHING: Are you having difficulty breathing? If Yes, ask: How bad is it? (e.g., mild, moderate, severe)      *No Answer* 6. FEVER: Do you have a fever? If Yes, ask: What is your temperature, how was it measured, and when did it start?     *No Answer* 7. CARDIAC HISTORY: Do you have any history of heart disease? (e.g., heart attack, congestive heart failure)      *No Answer* 8. LUNG HISTORY: Do you have any history of lung disease?  (e.g., pulmonary embolus, asthma, emphysema)     *No Answer* 9. PE RISK FACTORS: Do you have a history of blood clots? (or: recent major surgery, recent prolonged travel, bedridden)     *No Answer* 10. OTHER SYMPTOMS: Do you have any other symptoms? (e.g., runny nose, wheezing, chest pain)       *No  Answer* 11. PREGNANCY: Is there any chance you are pregnant? When was your last menstrual period?       *No Answer* 12. TRAVEL: Have you traveled out of the country in the last month? (e.g., travel history, exposures)       *No Answer*  Answer Assessment - Initial Assessment Questions Got back to town last week fro [place]. Last few days of trip started with tickle in her throat and runny nose. Tappered off for a few days and now with sinus congestion, post nasal drip, dry cough, chest congestion. Using Claritin as advised from pharmacist. Hot tea with honey to calm he throat. Denies CP just heaviness from congestion. Denies SOB, just having to catch her breath after coughing fit.  Acute visit made for today with Dr Vita. Patient will call back with any questions or concerns.   1. ONSET: When did the cough begin?      About 2 weeks ago 2. SEVERITY: How bad is the cough today?      Dry hacking cough- moderate 3. SPUTUM: Describe the color of your sputum (e.g., none, dry cough; clear, white, yellow, green)    Nothing  4. HEMOPTYSIS: Are you coughing up any blood? If Yes, ask: How much? (e.g., flecks, streaks, tablespoons, etc.)     Denies  5. DIFFICULTY BREATHING: Are you having difficulty breathing? If Yes, ask: How bad is it? (e.g., mild, moderate, severe)  Catch her breath after a coughing fit 6. FEVER: Do you have a fever? If Yes, ask: What is your temperature, how was it measured, and when did it start?     Denies- no chills  7. CARDIAC HISTORY: Do you have any history of heart disease? (e.g., heart attack, congestive heart failure)      denies 8. LUNG HISTORY: Do you have any history of lung disease?  (e.g., pulmonary embolus, asthma, emphysema)     Seasonal allergies 9. PE RISK FACTORS: Do you have a history of blood clots? (or: recent major surgery, recent prolonged travel, bedridden)     denies 10. OTHER SYMPTOMS: Do you have any other symptoms?  (e.g., runny nose, wheezing, chest pain)       Runny nose, post nasal drip, chest congestion/heavy 12. TRAVEL: Have you traveled out of the country in the last month? (e.g., travel history, exposures)       Just got back from [place] last week- started the last couple days  Protocols used: Cough - Acute Productive-A-AH, Cough - Acute Non-Productive-A-AH

## 2023-11-21 NOTE — Progress Notes (Signed)
 Name: Tamara Evans   Date of Visit: 11/21/23   Date of last visit with me: Visit date not found   CHIEF COMPLAINT:  Chief Complaint  Patient presents with   other    Sinus and chest congestion for about two weeks, not tested for covid or flu, cough, no ST, tickle in throat though, head ache in the morning some times, tired claritin, and generic allergy pills they did not work,        HPI:  Discussed the use of AI scribe software for clinical note transcription with the patient, who gave verbal consent to proceed.  History of Present Illness   Tamara Evans is a 78 year old female who presents with persistent cold symptoms after returning from [place].  She initially experienced a scratchy throat lasting two to three days, which progressed to cold-like symptoms. Her symptoms improved earlier in the week but have since returned.  Currently, she has a runny nose, persistent cough with a tickle in the throat, and mild chest pain. No fever or chills, although she has not taken her temperature. Her flu and COVID tests were negative. The symptoms have persisted for approximately two weeks.  She has experienced similar symptoms in the past and was treated with medication, though she does not recall the specific treatment. She has not used nasal sprays but was advised by a pharmacist to use Claritin, which did not alleviate her symptoms.  She recently returned from a trip to [place], visiting [place] and [place] with three friends.         OBJECTIVE:       12/03/2022    2:01 PM  Depression screen PHQ 2/9  Decreased Interest 0  Down, Depressed, Hopeless 1  PHQ - 2 Score 1  Altered sleeping 0  Tired, decreased energy 0  Change in appetite 0  Feeling bad or failure about yourself  0  Trouble concentrating 0  Moving slowly or fidgety/restless 0  Suicidal thoughts 0  PHQ-9 Score 1  Difficult doing work/chores Not difficult at all     BP Readings from Last 3 Encounters:   11/21/23 122/74  12/17/22 126/84  03/07/22 138/86    BP 122/74   Pulse 84   Wt 146 lb 12.8 oz (66.6 kg)   SpO2 98%   BMI 25.20 kg/m    Physical Exam          Physical Exam Constitutional:      Appearance: Normal appearance.  Pulmonary:     Effort: No respiratory distress.  Neurological:     General: No focal deficit present.     Mental Status: She is alert and oriented to person, place, and time. Mental status is at baseline.     ASSESSMENT/PLAN:   Assessment & Plan Cough, unspecified type  Sinus congestion  URI, acute    Assessment and Plan    Acute upper respiratory infection with cough and sinus congestion Symptoms persistent for two weeks with rhinorrhea, cough, pharyngeal irritation, and mild chest pain. Negative for influenza and COVID-19. Likely viral, but bacterial infection possible due to duration and severity. - Prescribed azithromycin  (Z-Pak) for possible bacterial infection. - Prescribed steroid pack to reduce inflammation and alleviate cough. - Recommended Flonase nasal spray, two sprays each nostril at night, for sinus congestion and pharyngeal irritation. - Advised follow-up if no improvement in 4-5 days for further imaging and evaluation.         Garnell Begeman A. Vita MD [facility]

## 2023-12-22 ENCOUNTER — Other Ambulatory Visit: Payer: Self-pay

## 2023-12-22 DIAGNOSIS — Z853 Personal history of malignant neoplasm of breast: Secondary | ICD-10-CM

## 2023-12-22 DIAGNOSIS — Z136 Encounter for screening for cardiovascular disorders: Secondary | ICD-10-CM

## 2023-12-22 DIAGNOSIS — E041 Nontoxic single thyroid nodule: Secondary | ICD-10-CM | POA: Diagnosis not present

## 2023-12-23 LAB — LIPID PANEL
Chol/HDL Ratio: 2.4 ratio (ref 0.0–4.4)
Cholesterol, Total: 154 mg/dL (ref 100–199)
HDL: 64 mg/dL (ref >39)
LDL Chol Calc (NIH): 71 mg/dL (ref 0–99)
Triglycerides: 103 mg/dL (ref 0–149)
VLDL Cholesterol Cal: 19 mg/dL (ref 5–40)

## 2023-12-23 LAB — COMPREHENSIVE METABOLIC PANEL WITH GFR
ALT: 32 IU/L (ref 0–32)
AST: 25 IU/L (ref 0–40)
Albumin: 4.1 g/dL (ref 3.8–4.8)
Alkaline Phosphatase: 91 IU/L (ref 49–135)
BUN/Creatinine Ratio: 31 — ABNORMAL HIGH (ref 12–28)
BUN: 25 mg/dL (ref 8–27)
Bilirubin Total: 0.7 mg/dL (ref 0.0–1.2)
CO2: 26 mmol/L (ref 20–29)
Calcium: 9.4 mg/dL (ref 8.7–10.3)
Chloride: 102 mmol/L (ref 96–106)
Creatinine, Ser: 0.8 mg/dL (ref 0.57–1.00)
Globulin, Total: 2 g/dL (ref 1.5–4.5)
Glucose: 102 mg/dL — ABNORMAL HIGH (ref 70–99)
Potassium: 4.2 mmol/L (ref 3.5–5.2)
Sodium: 144 mmol/L (ref 134–144)
Total Protein: 6.1 g/dL (ref 6.0–8.5)
eGFR: 76 mL/min/1.73 (ref >59)

## 2023-12-23 LAB — CBC WITH DIFFERENTIAL/PLATELET
Basophils Absolute: 0 x10E3/uL (ref 0.0–0.2)
Basos: 0 %
EOS (ABSOLUTE): 0.1 x10E3/uL (ref 0.0–0.4)
Eos: 2 %
Hematocrit: 47.5 % — ABNORMAL HIGH (ref 34.0–46.6)
Hemoglobin: 15.3 g/dL (ref 11.1–15.9)
Immature Grans (Abs): 0 x10E3/uL (ref 0.0–0.1)
Immature Granulocytes: 0 %
Lymphocytes Absolute: 1.4 x10E3/uL (ref 0.7–3.1)
Lymphs: 29 %
MCH: 26.3 pg — ABNORMAL LOW (ref 26.6–33.0)
MCHC: 32.2 g/dL (ref 31.5–35.7)
MCV: 82 fL (ref 79–97)
Monocytes Absolute: 0.5 x10E3/uL (ref 0.1–0.9)
Monocytes: 10 %
Neutrophils Absolute: 2.8 x10E3/uL (ref 1.4–7.0)
Neutrophils: 59 %
Platelets: 221 x10E3/uL (ref 150–450)
RBC: 5.81 x10E6/uL — ABNORMAL HIGH (ref 3.77–5.28)
RDW: 14.3 % (ref 11.7–15.4)
WBC: 4.8 x10E3/uL (ref 3.4–10.8)

## 2023-12-23 LAB — THYROID CASCADE PROFILE: TSH: 2.41 u[IU]/mL (ref 0.450–4.500)

## 2023-12-24 ENCOUNTER — Encounter: Payer: Self-pay | Admitting: Family Medicine

## 2023-12-24 ENCOUNTER — Ambulatory Visit: Payer: Medicare PPO | Admitting: Family Medicine

## 2023-12-24 VITALS — BP 124/74 | HR 64 | Ht 62.0 in | Wt 144.8 lb

## 2023-12-24 DIAGNOSIS — E7800 Pure hypercholesterolemia, unspecified: Secondary | ICD-10-CM | POA: Diagnosis not present

## 2023-12-24 DIAGNOSIS — Z1331 Encounter for screening for depression: Secondary | ICD-10-CM | POA: Diagnosis not present

## 2023-12-24 DIAGNOSIS — Z0000 Encounter for general adult medical examination without abnormal findings: Secondary | ICD-10-CM | POA: Diagnosis not present

## 2023-12-24 DIAGNOSIS — E041 Nontoxic single thyroid nodule: Secondary | ICD-10-CM | POA: Diagnosis not present

## 2023-12-24 MED ORDER — ATORVASTATIN CALCIUM 20 MG PO TABS: 20.0000 mg | ORAL_TABLET | Freq: Every day | ORAL | 3 refills | Status: AC

## 2023-12-24 MED ORDER — LEVOTHYROXINE SODIUM 88 MCG PO TABS: 88.0000 ug | ORAL_TABLET | Freq: Every day | ORAL | 2 refills | Status: AC

## 2023-12-24 NOTE — Progress Notes (Signed)
   Name: Tamara Evans   Date of Visit: 12/24/23   Date of last visit with me: 11/21/2023   CHIEF COMPLAINT:  Chief Complaint  Patient presents with   Annual Exam    Cpe and awv.        HPI:  Discussed the use of AI scribe software for clinical note transcription with the patient, who gave verbal consent to proceed.  History of Present Illness   Tamara Evans is a 78 year old female who presents for routine follow-up and lab review.  She experiences occasional headaches, which are manageable with Tylenol. These headaches are not predictable and are not associated with migraines. She attributes some symptoms to allergies, which have improved as the leaves have fallen.  Her hematocrit has been elevated for the past five years, with a current level of 47.5.  She consumes alcohol socially, typically having a glass of wine with a meal, but otherwise does not drink much.  Her current medications include Lipitor, with a two-month supply remaining, and Synthroid , which is up to date. 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         OBJECTIVE:       12/24/2023    2:29 PM  Depression screen PHQ 2/9  Decreased Interest 0  Down, Depressed, Hopeless 0  PHQ - 2 Score 0     BP Readings from Last 3 Encounters:  12/24/23 124/74  11/21/23 122/74  12/17/22 126/84    BP 124/74   Pulse 64   Ht 5' 2 (1.575 m)   Wt 144 lb 12.8 oz (65.7 kg)   SpO2 98%   BMI 26.48 kg/m    Physical Exam          Physical Exam Constitutional:      Appearance: Normal appearance.  Neurological:     General: No focal deficit present.     Mental Status: She is alert and oriented to person, place, and time. Mental status is at baseline.     ASSESSMENT/PLAN:   Assessment & Plan Encounter for Medicare annual wellness exam  Pure  hypercholesterolemia  Thyroid  nodule    Assessment and Plan    Adult Wellness Visit Routine visit with no new concerns. Immunizations up to date. - Continue routine wellness visits annually. - Comprehensive annual physical exam completed today. Reviewed interval history, current medical issues, medications, allergies, and preventive care needs. Addressed all patient questions and concerns. Discussed lifestyle factors including diet, exercise, sleep, and stress management. Reviewed recommended age-appropriate screenings, labs, and vaccinations. Counseling provided on healthy habits and routine health maintenance. Follow-up as indicated based on findings and results.   Pure hypercholesterolemia Cholesterol levels well-controlled with current medication. - Refilled Lipitor prescription. - Discussed lipid panels, values wnl  Nontoxic single thyroid  nodule Thyroid  function tests normal. No changes in management. - Continue current management.  Elevated hematocrit Hematocrit slightly elevated at 47.5, consistent with past levels. No symptoms of polycythemia. Hemoglobin stable. - Monitor hematocrit levels for any upward trend.         Tamara Dermody A. Vita MD [facility]

## 2023-12-24 NOTE — Progress Notes (Signed)
 Chief Complaint  Patient presents with   Annual Exam    Cpe and awv.      Subjective:   Tamara Evans is a 78 y.o. female who presents for a Medicare Annual Wellness Visit.  Allergies (verified) Codeine   History: Past Medical History:  Diagnosis Date   Allergy    perennial   Breast cancer (HCC) 2012   right breast stage I, er/pr positive, invasive ductal CA. Treated with lumpectomy and radiation   Fibrocystic breast disease    Goiter, nodular 05/06   managed by Dr. Loetta   Hx of radiation therapy    right breast   Hyperlipidemia    Menopause    Multinodular goiter    Osteopenia 6/07   L-spine   Personal history of radiation therapy    PMS (premenstrual syndrome)    Past Surgical History:  Procedure Laterality Date   BREAST BIOPSY Left 09/18/2010   benign   BREAST BIOPSY Right 09/18/2010   BREAST LUMPECTOMY  10/05/2010   lumpectomy and sentinel node biopsy (Right)   CERVICAL POLYPECTOMY     in the patient's 20's   Family History  Problem Relation Age of Onset   Dementia Mother    Osteoporosis Mother    Heart disease Mother        pacemaker   Diabetes Mother    Hypertension Father    Hyperlipidemia Father    Hyperlipidemia Brother    Cancer Maternal Grandfather        70's, ? type (smoker)   Diabetes Maternal Grandfather    Cancer Paternal Grandfather    Thyroid  disease Paternal Grandmother        goiter   Colon cancer Neg Hx    Social History   Occupational History   Occupation: Firefighter: [employer]  Tobacco Use   Smoking status: Never   Smokeless tobacco: Never  Vaping Use   Vaping status: Never Used  Substance and Sexual Activity   Alcohol use: Yes    Alcohol/week: 0.0 standard drinks of alcohol    Comment: 3-4 glasses wine per month.   Drug use: No   Sexual activity: Not Currently   Tobacco Counseling Counseling given: Not Answered  SDOH Screenings   Food Insecurity: No Food Insecurity (12/24/2023)  Housing: Low  Risk  (12/24/2023)  Transportation Needs: No Transportation Needs (12/24/2023)  Utilities: Not At Risk (12/24/2023)  Alcohol Screen: Low Risk  (12/20/2023)  Depression (PHQ2-9): Low Risk  (12/24/2023)  Financial Resource Strain: Low Risk  (12/20/2023)  Physical Activity: Sufficiently Active (12/24/2023)  Social Connections: Moderately Isolated (12/24/2023)  Stress: Stress Concern Present (12/24/2023)  Tobacco Use: Low Risk  (12/24/2023)  Health Literacy: Adequate Health Literacy (12/24/2023)   See flowsheets for full screening details  Depression Screen PHQ 2 & 9 Depression Scale- Over the past 2 weeks, how often have you been bothered by any of the following problems? Little interest or pleasure in doing things: 0 Feeling down, depressed, or hopeless (PHQ Adolescent also includes...irritable): 0 PHQ-2 Total Score: 0     Goals Addressed             This Visit's Progress    Patient Stated       Eat healthier.        Visit info / Clinical Intake: Medicare Wellness Visit Type:: Subsequent Annual Wellness Visit Persons participating in visit:: patient Medicare Wellness Visit Mode:: In-person (required for WTM) Information given by:: patient Interpreter Needed?: No  Pre-visit prep was completed: no AWV questionnaire completed by patient prior to visit?: no Living arrangements:: (!) lives alone Patient's Overall Health Status Rating: very good Typical amount of pain: none Does pain affect daily life?: no Are you currently prescribed opioids?: no  Dietary Habits and Nutritional Risks How many meals a day?: 3 Eats fruit and vegetables daily?: yes Most meals are obtained by: preparing own meals In the last 2 weeks, have you had any of the following?: none Diabetic:: no  Functional Status Activities of Daily Living (to include ambulation/medication): Independent Ambulation: Independent Medication Administration: Independent Home Management: Independent Manage your own  finances?: yes Primary transportation is: driving Concerns about vision?: no *vision screening is required for WTM* Concerns about hearing?: no  Fall Screening Falls in the past year?: 0 Number of falls in past year: 0 Was there an injury with Fall?: 0 Fall Risk Category Calculator: 0 Patient Fall Risk Level: Low Fall Risk  Fall Risk Patient at Risk for Falls Due to: No Fall Risks Fall risk Follow up: Falls evaluation completed  Home and Transportation Safety: All rugs have non-skid backing?: yes All stairs or steps have railings?: yes Grab bars in the bathtub or shower?: yes Have non-skid surface in bathtub or shower?: yes Good home lighting?: yes Regular seat belt use?: yes Hospital stays in the last year:: no  Cognitive Assessment Difficulty concentrating, remembering, or making decisions? : no Will 6CIT or Mini Cog be Completed: yes What year is it?: 0 points What month is it?: 0 points Give patient an address phrase to remember (5 components): [address] About what time is it?: 0 points Count backwards from 20 to 1: 0 points Say the months of the year in reverse: 0 points Repeat the address phrase from earlier: 0 points 6 CIT Score: 0 points  Advance Directives (For Healthcare) Does Patient Have a Medical Advance Directive?: Yes Does patient want to make changes to medical advance directive?: Yes (Inpatient - patient defers changing a medical advance directive at this time - Information given) Type of Advance Directive: Healthcare Power of [place]; Living will; Out of facility DNR (pink MOST or yellow form) Copy of Healthcare Power of Attorney in Chart?: Yes - validated most recent copy scanned in chart (See row information) Copy of Living Will in Chart?: Yes - validated most recent copy scanned in chart (See row information) Out of facility DNR (pink MOST or yellow form) in Chart? (Ambulatory ONLY): Yes - validated most recent copy scanned in  chart  Reviewed/Updated  Reviewed/Updated: Reviewed All (Medical, Surgical, Family, Medications, Allergies, Care Teams, Patient Goals)        Objective:    Today's Vitals   12/24/23 1432  BP: 124/74  Pulse: 64  SpO2: 98%  Weight: 144 lb 12.8 oz (65.7 kg)  Height: 5' 2 (1.575 m)   Body mass index is 26.48 kg/m.  Current Medications (verified) Outpatient Encounter Medications as of 12/24/2023  Medication Sig   atorvastatin  (LIPITOR) 20 MG tablet Take 1 tablet (20 mg total) by mouth daily.   calcium  carbonate (OS-CAL) 600 MG TABS Take 600 mg by mouth 1 day or 1 dose.   fluticasone  (FLONASE ) 50 MCG/ACT nasal spray Place 2 sprays into both nostrils daily.   levothyroxine  (SYNTHROID ) 88 MCG tablet TAKE 1 TABLET BY MOUTH EVERY DAY   Multiple Vitamin (MULTIVITAMIN) tablet Take 1 tablet by mouth daily.   [DISCONTINUED] methylPREDNISolone  (MEDROL  DOSEPAK) 4 MG TBPK tablet Take as directed. (Patient not taking: Reported  on 12/24/2023)   Facility-Administered Encounter Medications as of 12/24/2023  Medication   lidocaine  (PF) (XYLOCAINE ) 1 % injection 3 mL   Hearing/Vision screen No results found. Immunizations and Health Maintenance Health Maintenance  Topic Date Due   COVID-19 Vaccine (9 - 2025-26 season) 04/14/2024   Medicare Annual Wellness (AWV)  12/23/2024   DTaP/Tdap/Td (5 - Td or Tdap) 04/20/2031   Influenza Vaccine  Completed   Bone Density Scan  Completed   Hepatitis C Screening  Completed   Zoster Vaccines- Shingrix  Completed   Meningococcal B Vaccine  Aged Out   Pneumococcal Vaccine: 50+ Years  Discontinued   Mammogram  Discontinued   Colonoscopy  Discontinued        Assessment/Plan:  This is a routine wellness examination for [place].  Patient Care Team: Gasper Hopes, MD as PCP - General (Family Medicine)  I have personally reviewed and noted the following in the patient's chart:   Medical and social history Use of alcohol, tobacco or illicit drugs   Current medications and supplements including opioid prescriptions. Functional ability and status Nutritional status Physical activity Advanced directives List of other physicians Hospitalizations, surgeries, and ER visits in previous 12 months Vitals Screenings to include cognitive, depression, and falls Referrals and appointments  No orders of the defined types were placed in this encounter.  In addition, I have reviewed and discussed with patient certain preventive protocols, quality metrics, and best practice recommendations. A written personalized care plan for preventive services as well as general preventive health recommendations were provided to patient.   Tamara DELENA Bustle, MD   12/24/2023   No follow-ups on file.  After Visit Summary:  (In Person-Printed) AVS printed and given to the patient Nurse Notes: none

## 2024-02-03 ENCOUNTER — Encounter: Admitting: Internal Medicine

## 2024-02-09 ENCOUNTER — Encounter: Payer: Self-pay | Admitting: Family Medicine

## 2024-02-09 DIAGNOSIS — M8580 Other specified disorders of bone density and structure, unspecified site: Secondary | ICD-10-CM

## 2024-02-16 LAB — HM DEXA SCAN

## 2024-02-17 ENCOUNTER — Encounter: Payer: Self-pay | Admitting: Family Medicine

## 2024-12-28 ENCOUNTER — Encounter: Admitting: Family Medicine
